# Patient Record
Sex: Male | Born: 1960 | Race: White | Hispanic: No | Smoking: Never smoker
Health system: Southern US, Community
[De-identification: ages and names within clinical notes are randomized; demographics above are authoritative.]

## PROBLEM LIST (undated history)

## (undated) DIAGNOSIS — G4730 Sleep apnea, unspecified: Secondary | ICD-10-CM

## (undated) DIAGNOSIS — F419 Anxiety disorder, unspecified: Secondary | ICD-10-CM

## (undated) DIAGNOSIS — M1990 Unspecified osteoarthritis, unspecified site: Secondary | ICD-10-CM

## (undated) DIAGNOSIS — I10 Essential (primary) hypertension: Secondary | ICD-10-CM

## (undated) HISTORY — DX: Sleep apnea, unspecified: G47.30

## (undated) HISTORY — DX: Anxiety disorder, unspecified: F41.9

## (undated) HISTORY — DX: Unspecified osteoarthritis, unspecified site: M19.90

## (undated) HISTORY — DX: Essential (primary) hypertension: I10

---

## 2000-08-16 ENCOUNTER — Encounter: Payer: Self-pay | Admitting: Family Medicine

## 2000-08-16 ENCOUNTER — Encounter: Admission: RE | Admit: 2000-08-16 | Discharge: 2000-08-16 | Payer: Self-pay | Admitting: Family Medicine

## 2001-02-01 ENCOUNTER — Encounter: Payer: Self-pay | Admitting: Emergency Medicine

## 2001-02-02 ENCOUNTER — Encounter: Payer: Self-pay | Admitting: Emergency Medicine

## 2001-02-02 ENCOUNTER — Inpatient Hospital Stay (HOSPITAL_COMMUNITY): Admission: AC | Admit: 2001-02-02 | Discharge: 2001-02-06 | Payer: Self-pay | Admitting: Emergency Medicine

## 2003-12-05 ENCOUNTER — Encounter: Admission: RE | Admit: 2003-12-05 | Discharge: 2003-12-05 | Payer: Self-pay | Admitting: Family Medicine

## 2004-08-16 ENCOUNTER — Ambulatory Visit: Payer: Self-pay | Admitting: Oncology

## 2005-08-28 ENCOUNTER — Observation Stay (HOSPITAL_COMMUNITY): Admission: EM | Admit: 2005-08-28 | Discharge: 2005-08-29 | Payer: Self-pay | Admitting: Emergency Medicine

## 2007-02-05 ENCOUNTER — Ambulatory Visit (HOSPITAL_BASED_OUTPATIENT_CLINIC_OR_DEPARTMENT_OTHER): Admission: RE | Admit: 2007-02-05 | Discharge: 2007-02-05 | Payer: Self-pay | Admitting: Family Medicine

## 2007-02-08 ENCOUNTER — Ambulatory Visit: Payer: Self-pay | Admitting: Internal Medicine

## 2011-01-09 NOTE — Procedures (Signed)
NAME:  Danny Wilkerson, Danny Wilkerson             ACCOUNT NO.:  [id number]   MEDICAL RECORD NO.:  [id number]          PATIENT TYPE:  OUT   LOCATION:  SLEEP CENTER                 FACILITY:  [facility]   PHYSICIAN:  Clinton D. Maple Hudson, MD, FCCP, FACPDATE OF BIRTH:  1959-02-10   DATE OF STUDY:  02/05/2007                            NOCTURNAL POLYSOMNOGRAM   REFERRING PHYSICIAN:   REFERRING PHYSICIAN:  Gloriajean Dell. Andrey Campanile, MD.   INDICATION FOR STUDY:  Hypersomnia with sleep apnea.   EPWORTH SLEEPINESS SCORE:  2/24, BMI 29, weight 193 pounds.   HOME MEDICATIONS:  Listed and reviewed.   SLEEP ARCHITECTURE:  Total sleep time 294 minutes with sleep efficiency  62%.  Stage 1 was 4%, stage 2 63%, stages 3 and 4 10%, REM 24% of total  sleep time.  Sleep latency 32 minutes, REM latency 119 minutes, awake  after sleep onset 67 minutes, arousal index 15.9.  No bedtime medication  was taken.   RESPIRATORY DATA:  Split study protocol.  Apnea/hypopnea index (AHI,  RDI) 60.2 obstructive events per hour indicating severe obstructive  sleep apnea/hypopnea syndrome before CPAP.  This included 59 obstructive  apneas and 64 hypopneas before CPAP.  He slept almost exclusively supine  and most events were recorded in that position.  REM AHI 7.7.  CPAP was  titrated to 7 CWP, AHI 3 per hour.  A medium Comfort Gel nasal mask was  used with heated humidifier.   OXYGEN DATA:  Moderate snoring with oxygen desaturation transiently to a  nadir of 86%.  Mean oxygen saturation after CPAP control was 97% on room  air.   CARDIAC DATA:  Normal sinus rhythm.   MOVEMENT/PARASOMNIA:  Occasional limb jerk with little effect on sleep.   IMPRESSION/RECOMMENDATION:  1. Severe obstructive sleep apnea/hypopnea syndrome, apnea/hypopnea      index 60.2 per hour with most events and sleep recorded while      supine.  Moderate snoring with oxygen desaturation to a nadir of      86%.  2. Successful continuous positive airway pressure titration to  7      centimeters of water pressure, apnea/hypopnea index 3 per hour.  A      medium Comfort Gel nasal mask was used with heated humidifier.      Clinton D. Maple Hudson, MD, [facility], FACP  Diplomate, Biomedical engineer of Sleep Medicine  Electronically Signed     CDY/MEDQ  D:  02/09/2007 16:27:54  T:  02/09/2007 23:29:45  Job:  [id number]   cc:   Gloriajean Dell. Andrey Campanile, M.D.  Fax: [phone]
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
## 2013-09-24 DIAGNOSIS — R079 Chest pain, unspecified: Secondary | ICD-10-CM | POA: Insufficient documentation

## 2013-09-24 DIAGNOSIS — R059 Cough, unspecified: Secondary | ICD-10-CM | POA: Insufficient documentation

## 2013-09-24 DIAGNOSIS — F411 Generalized anxiety disorder: Secondary | ICD-10-CM | POA: Insufficient documentation

## 2013-09-24 DIAGNOSIS — F32A Depression, unspecified: Secondary | ICD-10-CM | POA: Insufficient documentation

## 2013-09-24 DIAGNOSIS — Q849 Congenital malformation of integument, unspecified: Secondary | ICD-10-CM | POA: Insufficient documentation

## 2013-09-24 DIAGNOSIS — I10 Essential (primary) hypertension: Secondary | ICD-10-CM | POA: Insufficient documentation

## 2014-03-27 ENCOUNTER — Encounter: Payer: Self-pay | Admitting: *Deleted

## 2017-12-24 ENCOUNTER — Other Ambulatory Visit: Payer: Self-pay | Admitting: Internal Medicine

## 2017-12-24 DIAGNOSIS — M5432 Sciatica, left side: Secondary | ICD-10-CM

## 2018-01-16 ENCOUNTER — Ambulatory Visit
Admission: RE | Admit: 2018-01-16 | Discharge: 2018-01-16 | Disposition: A | Discharge status: Home / Self Care | Payer: 59 | Source: Ambulatory Visit | Attending: Internal Medicine | Admitting: Internal Medicine

## 2018-01-16 DIAGNOSIS — M5432 Sciatica, left side: Secondary | ICD-10-CM

## 2019-09-24 DIAGNOSIS — Z79899 Other long term (current) drug therapy: Secondary | ICD-10-CM | POA: Diagnosis not present

## 2019-09-24 DIAGNOSIS — Z13220 Encounter for screening for lipoid disorders: Secondary | ICD-10-CM | POA: Diagnosis not present

## 2019-09-24 DIAGNOSIS — M109 Gout, unspecified: Secondary | ICD-10-CM | POA: Diagnosis not present

## 2019-09-24 DIAGNOSIS — R7303 Prediabetes: Secondary | ICD-10-CM | POA: Diagnosis not present

## 2019-09-24 DIAGNOSIS — Z0000 Encounter for general adult medical examination without abnormal findings: Secondary | ICD-10-CM | POA: Diagnosis not present

## 2019-09-24 DIAGNOSIS — Z03818 Encounter for observation for suspected exposure to other biological agents ruled out: Secondary | ICD-10-CM | POA: Diagnosis not present

## 2019-12-14 DIAGNOSIS — E559 Vitamin D deficiency, unspecified: Secondary | ICD-10-CM | POA: Diagnosis not present

## 2019-12-14 DIAGNOSIS — M5432 Sciatica, left side: Secondary | ICD-10-CM | POA: Diagnosis not present

## 2019-12-14 DIAGNOSIS — E782 Mixed hyperlipidemia: Secondary | ICD-10-CM | POA: Diagnosis not present

## 2019-12-14 DIAGNOSIS — I10 Essential (primary) hypertension: Secondary | ICD-10-CM | POA: Diagnosis not present

## 2019-12-14 DIAGNOSIS — Z79899 Other long term (current) drug therapy: Secondary | ICD-10-CM | POA: Diagnosis not present

## 2020-05-05 DIAGNOSIS — M5432 Sciatica, left side: Secondary | ICD-10-CM | POA: Diagnosis not present

## 2020-05-05 DIAGNOSIS — I10 Essential (primary) hypertension: Secondary | ICD-10-CM | POA: Diagnosis not present

## 2020-05-05 DIAGNOSIS — F430 Acute stress reaction: Secondary | ICD-10-CM | POA: Diagnosis not present

## 2020-05-05 DIAGNOSIS — F410 Panic disorder [episodic paroxysmal anxiety] without agoraphobia: Secondary | ICD-10-CM | POA: Diagnosis not present

## 2020-05-05 DIAGNOSIS — Z79899 Other long term (current) drug therapy: Secondary | ICD-10-CM | POA: Diagnosis not present

## 2020-05-24 ENCOUNTER — Other Ambulatory Visit: Payer: Self-pay | Admitting: *Deleted

## 2020-05-24 ENCOUNTER — Encounter: Payer: Self-pay | Admitting: *Deleted

## 2020-08-29 ENCOUNTER — Encounter: Payer: Self-pay | Admitting: Family Medicine

## 2020-08-29 ENCOUNTER — Ambulatory Visit (INDEPENDENT_AMBULATORY_CARE_PROVIDER_SITE_OTHER): Payer: BC Managed Care – PPO | Admitting: Family Medicine

## 2020-08-29 ENCOUNTER — Ambulatory Visit (INDEPENDENT_AMBULATORY_CARE_PROVIDER_SITE_OTHER): Payer: BC Managed Care – PPO

## 2020-08-29 ENCOUNTER — Other Ambulatory Visit: Payer: Self-pay

## 2020-08-29 VITALS — BP 143/72 | HR 82 | Temp 98.5°F | Ht 68.0 in | Wt 218.0 lb

## 2020-08-29 DIAGNOSIS — R079 Chest pain, unspecified: Secondary | ICD-10-CM

## 2020-08-29 DIAGNOSIS — Z13220 Encounter for screening for lipoid disorders: Secondary | ICD-10-CM

## 2020-08-29 DIAGNOSIS — Z125 Encounter for screening for malignant neoplasm of prostate: Secondary | ICD-10-CM

## 2020-08-29 DIAGNOSIS — K219 Gastro-esophageal reflux disease without esophagitis: Secondary | ICD-10-CM

## 2020-08-29 DIAGNOSIS — H9319 Tinnitus, unspecified ear: Secondary | ICD-10-CM

## 2020-08-29 DIAGNOSIS — Z0001 Encounter for general adult medical examination with abnormal findings: Secondary | ICD-10-CM | POA: Diagnosis not present

## 2020-08-29 DIAGNOSIS — S76219A Strain of adductor muscle, fascia and tendon of unspecified thigh, initial encounter: Secondary | ICD-10-CM | POA: Diagnosis not present

## 2020-08-29 DIAGNOSIS — L570 Actinic keratosis: Secondary | ICD-10-CM

## 2020-08-29 DIAGNOSIS — R739 Hyperglycemia, unspecified: Secondary | ICD-10-CM | POA: Diagnosis not present

## 2020-08-29 DIAGNOSIS — I10 Essential (primary) hypertension: Secondary | ICD-10-CM

## 2020-08-29 DIAGNOSIS — M1990 Unspecified osteoarthritis, unspecified site: Secondary | ICD-10-CM

## 2020-08-29 DIAGNOSIS — Z1211 Encounter for screening for malignant neoplasm of colon: Secondary | ICD-10-CM

## 2020-08-29 LAB — COMPREHENSIVE METABOLIC PANEL
ALT: 52 U/L (ref 0–53)
AST: 40 U/L — ABNORMAL HIGH (ref 0–37)
Albumin: 4.4 g/dL (ref 3.5–5.2)
Alkaline Phosphatase: 64 U/L (ref 39–117)
BUN: 11 mg/dL (ref 6–23)
CO2: 27 mEq/L (ref 19–32)
Calcium: 9.3 mg/dL (ref 8.4–10.5)
Chloride: 101 mEq/L (ref 96–112)
Creatinine, Ser: 0.86 mg/dL (ref 0.40–1.50)
GFR: 93.2 mL/min (ref >60.00)
Glucose, Bld: 111 mg/dL — ABNORMAL HIGH (ref 70–99)
Potassium: 4.1 mEq/L (ref 3.5–5.1)
Sodium: 136 mEq/L (ref 135–145)
Total Bilirubin: 0.5 mg/dL (ref 0.2–1.2)
Total Protein: 6.9 g/dL (ref 6.0–8.3)

## 2020-08-29 LAB — LIPID PANEL
Cholesterol: 194 mg/dL (ref 0–200)
HDL: 28.9 mg/dL — ABNORMAL LOW (ref >39.00)
NonHDL: 165.42
Total CHOL/HDL Ratio: 7
Triglycerides: 218 mg/dL — ABNORMAL HIGH (ref 0.0–149.0)
VLDL: 43.6 mg/dL — ABNORMAL HIGH (ref 0.0–40.0)

## 2020-08-29 LAB — CBC
HCT: 46.9 % (ref 39.0–52.0)
Hemoglobin: 16.1 g/dL (ref 13.0–17.0)
MCHC: 34.2 g/dL (ref 30.0–36.0)
MCV: 97.2 fl (ref 78.0–100.0)
Platelets: 188 10*3/uL (ref 150.0–400.0)
RBC: 4.83 Mil/uL (ref 4.22–5.81)
RDW: 12.8 % (ref 11.5–15.5)
WBC: 8.4 10*3/uL (ref 4.0–10.5)

## 2020-08-29 LAB — LDL CHOLESTEROL, DIRECT: Direct LDL: 136 mg/dL

## 2020-08-29 LAB — PSA: PSA: 6.1 ng/mL — ABNORMAL HIGH (ref 0.10–4.00)

## 2020-08-29 LAB — TSH: TSH: 2.21 u[IU]/mL (ref 0.35–4.50)

## 2020-08-29 LAB — HEMOGLOBIN A1C: Hgb A1c MFr Bld: 6.3 % (ref 4.6–6.5)

## 2020-08-29 NOTE — Progress Notes (Signed)
Chief Complaint:  Danny Wilkerson is a 60 y.o. male who presents today for his annual comprehensive physical exam.    Assessment/Plan:  New/Acute Problems: Actinic Keratosis Cryotherapy applied today.  See below procedure note.  He tolerated well.  Chest Pain Atypical for cardiac etiology.  EKG today with normal sinus rhythm and no ischemic changes.  Will check chest x-ray.  Likely musculoskeletal given reproducibility on exam.  Groin Strain No red flags.  Discussed home exercises and handout was given.  Chronic Problems Addressed Today: GERD (gastroesophageal reflux disease) On omeprazole 40mg  daily and tolerating well.   Arthritis Has seen back doctor in the past.  Does not currently want surgery.  Symptoms are manageable.  Tinnitus Stable.  Benign essential hypertension At goal per JNC 8.  Amlodipine-valsartan 5-160 daily.   Body mass index is 33.15 kg/m. / Obese  BMI Metric Follow Up - 08/29/20 1012      BMI Metric Follow Up-Please document annually   BMI Metric Follow Up Education provided            Preventative Healthcare: We will place referral for colonoscopy.  Check CBC, CMET, TSH, PSA, lipid panel.  Patient Counseling(The following topics were reviewed and/or handout was given):  -Nutrition: Stressed importance of moderation in sodium/caffeine intake, saturated fat and cholesterol, caloric balance, sufficient intake of fresh fruits, vegetables, and fiber.  -Stressed the importance of regular exercise.   -Substance Abuse: Discussed cessation/primary prevention of tobacco, alcohol, or other drug use; driving or other dangerous activities under the influence; availability of treatment for abuse.   -Injury prevention: Discussed safety belts, safety helmets, smoke detector, smoking near bedding or upholstery.   -Sexuality: Discussed sexually transmitted diseases, partner selection, use of condoms, avoidance of unintended pregnancy and contraceptive  alternatives.   -Dental health: Discussed importance of regular tooth brushing, flossing, and dental visits.  -Health maintenance and immunizations reviewed. Please refer to Health maintenance section.  Return to care in 1 year for next preventative visit.     Subjective:  HPI:  See A/p for status of chronic conditions.  He has a few additional issues he would like to discuss today:  He has been having right-sided chest pain for the past year or so.  Worse with certain motions such as twisting or bending.  No shortness of breath.  No pain at rest.  Recently also had an episode of chest pain on the left side of his chest.  This occurred with exertion.  Lasted for a few hours and then went away.  He also has a spot on the scalp that has been there for several years he would like to have looked at.  Will occasionally become crusted over and he will take it off.  He also has had left groin pain for about a year. He injuried it about a year ago while doing a class.  Worse with movement.  No pain at rest.  Lifestyle Diet: Balanced. Getting plenty of fruits and vegetables.  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   Depression screen PHQ 2/9 08/29/2020  Decreased Interest 0  Down, Depressed, Hopeless 0  PHQ - 2 Score 0   Health Maintenance Due  Topic Date Due  . Hepatitis C Screening  Never done  . HIV Screening  Never done  . COLONOSCOPY (Pts 45-28yrs Insurance coverage will need to be confirmed)  08/28/2019    ROS: Per HPI, otherwise a complete review of systems was negative.  PMH:  The following were reviewed and entered/updated in epic: Past Medical History:  Diagnosis Date  . Anxiety   . Arthritis   . Hypertension   . Sleep apnea    Patient Active Problem List   Diagnosis Date Noted  . Tinnitus 08/29/2020  . GERD (gastroesophageal reflux disease) 08/29/2020  . Arthritis   . Benign essential hypertension 09/24/2013   History reviewed.  No pertinent surgical history.  Family History  Problem Relation Age of Onset  . Arthritis Mother   . Cancer Mother   . Depression Mother   . Hearing loss Mother   . Arthritis Father   . COPD Father   . Hearing loss Father     Medications- reviewed and updated Current Outpatient Medications  Medication Sig Dispense Refill  . amLODipine-valsartan (EXFORGE) 5-160 MG tablet Take 1 tablet by mouth daily.    . NEOMYCIN-POLYMYXIN-HYDROCORTISONE (CORTISPORIN) 1 % SOLN OTIC solution SMARTSIG:2 Drop(s) In Ear(s) 3 Times Daily PRN    . omeprazole (PRILOSEC) 40 MG capsule Take 40 mg by mouth daily.     No current facility-administered medications for this visit.    Allergies-reviewed and updated Allergies  Allergen Reactions  . Ampicillin Other (See Comments)    Childhood reaction  . Meperidine Nausea And Vomiting  . Prednisone Other (See Comments)    SUICIDAL    Social History   Socioeconomic History  . Marital status: Divorced    Spouse name: Not on file  . Number of children: Not on file  . Years of education: Not on file  . Highest education level: Not on file  Occupational History  . Not on file  Tobacco Use  . Smoking status: Never Smoker  . Smokeless tobacco: Not on file  Substance and Sexual Activity  . Alcohol use: Yes    Alcohol/week: 12.0 standard drinks    Types: 12 Cans of beer per week  . Drug use: Never  . Sexual activity: Not Currently  Other Topics Concern  . Not on file  Social History Narrative  . Not on file   Social Determinants of Health   Financial Resource Strain: Not on file  Food Insecurity: Not on file  Transportation Needs: Not on file  Physical Activity: Not on file  Stress: Not on file  Social Connections: Not on file        Objective:  Physical Exam: BP (!) 143/72   Pulse 82   Temp 98.5 F (36.9 C) (Temporal)   Ht 5\' 8"  (1.727 m)   Wt 218 lb (98.9 kg)   SpO2 98%   BMI 33.15 kg/m   Body mass index is 33.15 kg/m. Wt  Readings from Last 3 Encounters:  08/29/20 218 lb (98.9 kg)   Gen: NAD, resting comfortably HEENT: TMs normal bilaterally. OP clear. No thyromegaly noted.  CV: RRR with no murmurs appreciated Pulm: NWOB, CTAB with no crackles, wheezes, or rhonchi GI: Normal bowel sounds present. Soft, Nontender, Nondistended. MSK: no edema, cyanosis, or clubbing noted.  Chest pain reproducible on exam on right side of chest.  Pain elicited with left hip adduction.  No hernias noted. Skin: warm, dry.  Excoriated lesion approximately 5 mm in diameter on right frontal scalp. Neuro: CN2-12 grossly intact. Strength 5/5 in upper and lower extremities. Reflexes symmetric and intact bilaterally.  Psych: Normal affect and thought content  EKG: NSR.  No ischemic changes.  Cryotherapy Procedure Note  Pre-operative Diagnosis: Actinic keratosis  Locations: Right forehead  Indications: Therapeutic  Procedure  Details  Patient informed of risks (permanent scarring, infection, light or dark discoloration, bleeding, infection, weakness, numbness and recurrence of the lesion) and benefits of the procedure and verbal informed consent obtained.  The areas are treated with liquid nitrogen therapy, frozen until ice ball extended 2 mm beyond lesion, allowed to thaw, and treated again. The patient tolerated procedure well.  The patient was instructed on post-op care, warned that there may be blister formation, redness and pain. Recommend OTC analgesia as needed for pain.  Condition: Stable  Complications: none.       Algis Greenhouse. Jerline Pain, MD 08/29/2020 10:13 AM

## 2020-08-29 NOTE — Assessment & Plan Note (Signed)
Has seen back doctor in the past.  Does not currently want surgery.  Symptoms are manageable.

## 2020-08-29 NOTE — Assessment & Plan Note (Signed)
At goal per JNC 8.  Amlodipine-valsartan 5-160 daily.

## 2020-08-29 NOTE — Assessment & Plan Note (Signed)
On omeprazole 40mg  daily and tolerating well.

## 2020-08-29 NOTE — Addendum Note (Signed)
Addended by: Lurlean Horns on: 08/29/2020 10:27 AM   Modules accepted: Orders

## 2020-08-29 NOTE — Assessment & Plan Note (Signed)
Stable

## 2020-08-29 NOTE — Patient Instructions (Addendum)
It was very nice to see you today!  Your EKG today is normal.  Please let me know if you have any substernal chest pressure or any associated shortness of breath.  We will check a chest x-ray today.  We froze a spot on your forehead today.  Please let me know if it does not improve.  I think you have a groin strain as well.  Please try working on a home exercises.  No other changes today we will check blood work.  I will see back in year for your next physical.  Please come back to see me sooner if needed.  Take care, Dr Jerline Pain  Please try these tips to maintain a healthy lifestyle:   Eat at least 3 REAL meals and 1-2 snacks per day.  Aim for no more than 5 hours between eating.  If you eat breakfast, please do so within one hour of getting up.    Each meal should contain half fruits/vegetables, one quarter protein, and one quarter carbs (no bigger than a computer mouse)   Cut down on sweet beverages. This includes juice, soda, and sweet tea.     Drink at least 1 glass of water with each meal and aim for at least 8 glasses per day   Exercise at least 150 minutes every week.    Preventive Care 68-54 Years Old, Male Preventive care refers to lifestyle choices and visits with your health care provider that can promote health and wellness. This includes:  A yearly physical exam. This is also called an annual well check.  Regular dental and eye exams.  Immunizations.  Screening for certain conditions.  Healthy lifestyle choices, such as eating a healthy diet, getting regular exercise, not using drugs or products that contain nicotine and tobacco, and limiting alcohol use. What can I expect for my preventive care visit? Physical exam Your health care provider will check:  Height and weight. These may be used to calculate body mass index (BMI), which is a measurement that tells if you are at a healthy weight.  Heart rate and blood pressure.  Your skin for abnormal  spots. Counseling Your health care provider may ask you questions about:  Alcohol, tobacco, and drug use.  Emotional well-being.  Home and relationship well-being.  Sexual activity.  Eating habits.  Work and work Statistician. What immunizations do I need?  Influenza (flu) vaccine  This is recommended every year. Tetanus, diphtheria, and pertussis (Tdap) vaccine  You may need a Td booster every 10 years. Varicella (chickenpox) vaccine  You may need this vaccine if you have not already been vaccinated. Zoster (shingles) vaccine  You may need this after age 9. Measles, mumps, and rubella (MMR) vaccine  You may need at least one dose of MMR if you were born in 1957 or later. You may also need a second dose. Pneumococcal conjugate (PCV13) vaccine  You may need this if you have certain conditions and were not previously vaccinated. Pneumococcal polysaccharide (PPSV23) vaccine  You may need one or two doses if you smoke cigarettes or if you have certain conditions. Meningococcal conjugate (MenACWY) vaccine  You may need this if you have certain conditions. Hepatitis A vaccine  You may need this if you have certain conditions or if you travel or work in places where you may be exposed to hepatitis A. Hepatitis B vaccine  You may need this if you have certain conditions or if you travel or work in places where you may  be exposed to hepatitis B. Haemophilus influenzae type b (Hib) vaccine  You may need this if you have certain risk factors. Human papillomavirus (HPV) vaccine  If recommended by your health care provider, you may need three doses over 6 months. You may receive vaccines as individual doses or as more than one vaccine together in one shot (combination vaccines). Talk with your health care provider about the risks and benefits of combination vaccines. What tests do I need? Blood tests  Lipid and cholesterol levels. These may be checked every 5 years, or  more frequently if you are over 55 years old.  Hepatitis C test.  Hepatitis B test. Screening  Lung cancer screening. You may have this screening every year starting at age 49 if you have a 30-pack-year history of smoking and currently smoke or have quit within the past 15 years.  Prostate cancer screening. Recommendations will vary depending on your family history and other risks.  Colorectal cancer screening. All adults should have this screening starting at age 64 and continuing until age 53. Your health care provider may recommend screening at age 80 if you are at increased risk. You will have tests every 1-10 years, depending on your results and the type of screening test.  Diabetes screening. This is done by checking your blood sugar (glucose) after you have not eaten for a while (fasting). You may have this done every 1-3 years.  Sexually transmitted disease (STD) testing. Follow these instructions at home: Eating and drinking  Eat a diet that includes fresh fruits and vegetables, whole grains, lean protein, and low-fat dairy products.  Take vitamin and mineral supplements as recommended by your health care provider.  Do not drink alcohol if your health care provider tells you not to drink.  If you drink alcohol: ? Limit how much you have to 0-2 drinks a day. ? Be aware of how much alcohol is in your drink. In the [place] one drink equals one 12 oz bottle of beer (355 mL), one 5 oz glass of wine (148 mL), or one 1 oz glass of hard liquor (44 mL). Lifestyle  Take daily care of your teeth and gums.  Stay active. Exercise for at least 30 minutes on 5 or more days each week.  Do not use any products that contain nicotine or tobacco, such as cigarettes, e-cigarettes, and chewing tobacco. If you need help quitting, ask your health care provider.  If you are sexually active, practice safe sex. Use a condom or other form of protection to prevent STIs (sexually transmitted  infections).  Talk with your health care provider about taking a low-dose aspirin every day starting at age 61. What's next?  Go to your health care provider once a year for a well check visit.  Ask your health care provider how often you should have your eyes and teeth checked.  Stay up to date on all vaccines. This information is not intended to replace advice given to you by your health care provider. Make sure you discuss any questions you have with your health care provider. Document Revised: 08/07/2018 Document Reviewed: 08/07/2018 Elsevier Patient Education  2020 [employer].

## 2020-08-30 ENCOUNTER — Encounter: Payer: Self-pay | Admitting: Family Medicine

## 2020-08-30 DIAGNOSIS — R972 Elevated prostate specific antigen [PSA]: Secondary | ICD-10-CM | POA: Insufficient documentation

## 2020-08-30 DIAGNOSIS — R739 Hyperglycemia, unspecified: Secondary | ICD-10-CM | POA: Insufficient documentation

## 2020-08-30 DIAGNOSIS — E785 Hyperlipidemia, unspecified: Secondary | ICD-10-CM | POA: Insufficient documentation

## 2020-08-30 NOTE — Progress Notes (Signed)
Please inform patient of the following:  PSA is elevated. Recommend urology referral.  Cholesterol and blood sugar are both borderline.  He would benefit from starting Lipitor 40 mg daily to improve cholesterol numbers and lower risk of heart attack and stroke.  Regardless he should continue working on diet and exercise and we can recheck in a year.  All his other labs are normal.

## 2020-08-30 NOTE — Progress Notes (Signed)
Please inform patient of the following:  Chest xray is normal.  Oniel Meleski M. Jimmey Ralph, MD 08/30/2020 9:31 AM

## 2020-08-31 ENCOUNTER — Other Ambulatory Visit: Payer: Self-pay | Admitting: *Deleted

## 2020-08-31 DIAGNOSIS — R972 Elevated prostate specific antigen [PSA]: Secondary | ICD-10-CM

## 2020-08-31 MED ORDER — ATORVASTATIN CALCIUM 40 MG PO TABS: 40.0000 mg | ORAL_TABLET | Freq: Every day | ORAL | 3 refills | Status: DC

## 2020-08-31 NOTE — Progress Notes (Signed)
L 

## 2020-10-07 DIAGNOSIS — N5201 Erectile dysfunction due to arterial insufficiency: Secondary | ICD-10-CM | POA: Diagnosis not present

## 2020-10-07 DIAGNOSIS — R972 Elevated prostate specific antigen [PSA]: Secondary | ICD-10-CM | POA: Diagnosis not present

## 2020-10-19 DIAGNOSIS — Z01818 Encounter for other preprocedural examination: Secondary | ICD-10-CM | POA: Diagnosis not present

## 2020-10-19 DIAGNOSIS — Z1211 Encounter for screening for malignant neoplasm of colon: Secondary | ICD-10-CM | POA: Diagnosis not present

## 2020-10-19 DIAGNOSIS — R7989 Other specified abnormal findings of blood chemistry: Secondary | ICD-10-CM | POA: Diagnosis not present

## 2020-10-19 LAB — HM COLONOSCOPY

## 2020-11-08 DIAGNOSIS — K635 Polyp of colon: Secondary | ICD-10-CM | POA: Diagnosis not present

## 2020-11-08 DIAGNOSIS — Z1211 Encounter for screening for malignant neoplasm of colon: Secondary | ICD-10-CM | POA: Diagnosis not present

## 2020-11-08 DIAGNOSIS — R945 Abnormal results of liver function studies: Secondary | ICD-10-CM | POA: Diagnosis not present

## 2020-11-15 DIAGNOSIS — K635 Polyp of colon: Secondary | ICD-10-CM | POA: Diagnosis not present

## 2020-12-26 ENCOUNTER — Other Ambulatory Visit: Payer: Self-pay

## 2020-12-26 MED ORDER — AMLODIPINE BESYLATE-VALSARTAN 5-160 MG PO TABS: 1.0000 | ORAL_TABLET | Freq: Every day | ORAL | 3 refills | Status: DC

## 2020-12-26 NOTE — Telephone Encounter (Signed)
MEDICATION: amLODipine-valsartan (EXFORGE) 5-160 MG tablet  PHARMACY:  [id number] - [place], [place] - [address] Phone:  [phone]  Fax:  [phone]       Comments: Patient said he is out.   **Let patient know to contact pharmacy at the end of the day to make sure medication is ready. **  ** Please notify patient to allow 48-72 hours to process**  **Encourage patient to contact the pharmacy for refills or they can request refills through [facility]**

## 2020-12-26 NOTE — Telephone Encounter (Signed)
LAST APPOINTMENT DATE: 08/29/2020   NEXT APPOINTMENT DATE: Visit date not found    LAST REFILL: [id number]  QTY: Last refill done by historical provider

## 2020-12-28 DIAGNOSIS — R7989 Other specified abnormal findings of blood chemistry: Secondary | ICD-10-CM | POA: Diagnosis not present

## 2020-12-28 DIAGNOSIS — K648 Other hemorrhoids: Secondary | ICD-10-CM | POA: Diagnosis not present

## 2020-12-28 DIAGNOSIS — D126 Benign neoplasm of colon, unspecified: Secondary | ICD-10-CM | POA: Diagnosis not present

## 2021-04-27 DIAGNOSIS — S0501XA Injury of conjunctiva and corneal abrasion without foreign body, right eye, initial encounter: Secondary | ICD-10-CM | POA: Diagnosis not present

## 2021-07-12 ENCOUNTER — Encounter: Payer: Self-pay | Admitting: Family Medicine

## 2021-08-20 ENCOUNTER — Other Ambulatory Visit: Payer: Self-pay | Admitting: Family Medicine

## 2021-08-30 ENCOUNTER — Other Ambulatory Visit: Payer: Self-pay

## 2021-08-30 ENCOUNTER — Encounter: Payer: Self-pay | Admitting: Family Medicine

## 2021-08-30 ENCOUNTER — Ambulatory Visit (INDEPENDENT_AMBULATORY_CARE_PROVIDER_SITE_OTHER): Payer: BC Managed Care – PPO | Admitting: Family Medicine

## 2021-08-30 VITALS — BP 124/68 | HR 79 | Temp 98.4°F | Ht 68.0 in | Wt 213.2 lb

## 2021-08-30 DIAGNOSIS — R739 Hyperglycemia, unspecified: Secondary | ICD-10-CM | POA: Diagnosis not present

## 2021-08-30 DIAGNOSIS — Z0001 Encounter for general adult medical examination with abnormal findings: Secondary | ICD-10-CM

## 2021-08-30 DIAGNOSIS — K219 Gastro-esophageal reflux disease without esophagitis: Secondary | ICD-10-CM | POA: Diagnosis not present

## 2021-08-30 DIAGNOSIS — F411 Generalized anxiety disorder: Secondary | ICD-10-CM | POA: Diagnosis not present

## 2021-08-30 DIAGNOSIS — Z0000 Encounter for general adult medical examination without abnormal findings: Secondary | ICD-10-CM | POA: Diagnosis not present

## 2021-08-30 DIAGNOSIS — E785 Hyperlipidemia, unspecified: Secondary | ICD-10-CM | POA: Diagnosis not present

## 2021-08-30 DIAGNOSIS — I10 Essential (primary) hypertension: Secondary | ICD-10-CM

## 2021-08-30 DIAGNOSIS — E669 Obesity, unspecified: Secondary | ICD-10-CM

## 2021-08-30 DIAGNOSIS — R972 Elevated prostate specific antigen [PSA]: Secondary | ICD-10-CM | POA: Diagnosis not present

## 2021-08-30 DIAGNOSIS — Z6832 Body mass index (BMI) 32.0-32.9, adult: Secondary | ICD-10-CM

## 2021-08-30 DIAGNOSIS — G8929 Other chronic pain: Secondary | ICD-10-CM | POA: Diagnosis not present

## 2021-08-30 LAB — COMPREHENSIVE METABOLIC PANEL
ALT: 20 U/L (ref 0–53)
AST: 20 U/L (ref 0–37)
Albumin: 4.4 g/dL (ref 3.5–5.2)
Alkaline Phosphatase: 58 U/L (ref 39–117)
BUN: 12 mg/dL (ref 6–23)
CO2: 26 mEq/L (ref 19–32)
Calcium: 9.6 mg/dL (ref 8.4–10.5)
Chloride: 104 mEq/L (ref 96–112)
Creatinine, Ser: 0.87 mg/dL (ref 0.40–1.50)
GFR: 92.22 mL/min (ref >60.00)
Glucose, Bld: 107 mg/dL — ABNORMAL HIGH (ref 70–99)
Potassium: 4.1 mEq/L (ref 3.5–5.1)
Sodium: 138 mEq/L (ref 135–145)
Total Bilirubin: 0.7 mg/dL (ref 0.2–1.2)
Total Protein: 7.2 g/dL (ref 6.0–8.3)

## 2021-08-30 LAB — TSH: TSH: 2.79 u[IU]/mL (ref 0.35–5.50)

## 2021-08-30 LAB — CBC
HCT: 42.3 % (ref 39.0–52.0)
Hemoglobin: 14.2 g/dL (ref 13.0–17.0)
MCHC: 33.6 g/dL (ref 30.0–36.0)
MCV: 95.4 fl (ref 78.0–100.0)
Platelets: 216 10*3/uL (ref 150.0–400.0)
RBC: 4.44 Mil/uL (ref 4.22–5.81)
RDW: 12.3 % (ref 11.5–15.5)
WBC: 7.5 10*3/uL (ref 4.0–10.5)

## 2021-08-30 LAB — LIPID PANEL
Cholesterol: 137 mg/dL (ref 0–200)
HDL: 33.3 mg/dL — ABNORMAL LOW (ref >39.00)
LDL Cholesterol: 71 mg/dL (ref 0–99)
NonHDL: 104.15
Total CHOL/HDL Ratio: 4
Triglycerides: 164 mg/dL — ABNORMAL HIGH (ref 0.0–149.0)
VLDL: 32.8 mg/dL (ref 0.0–40.0)

## 2021-08-30 LAB — PSA: PSA: 1.17 ng/mL (ref 0.10–4.00)

## 2021-08-30 LAB — HEMOGLOBIN A1C: Hgb A1c MFr Bld: 6 % (ref 4.6–6.5)

## 2021-08-30 MED ORDER — HYDROCODONE-ACETAMINOPHEN 5-325 MG PO TABS: 1.0000 | ORAL_TABLET | Freq: Four times a day (QID) | ORAL | 0 refills | Status: DC | PRN

## 2021-08-30 NOTE — Patient Instructions (Signed)
It was very nice to see you today!  We will check blood work today.  I will refill your medication.  We will check a cardiac CT scan.  I will see you back in a year for your next physical.   Please come back in 3-6 months for a med check if needed for your hydrocodone.   Come back to see me sooner if needed.   Take care, Dr Jimmey Ralph  PLEASE NOTE:  If you had any lab tests please let us know if you have not heard back within a few days. You may see your results on mychart before we have a chance to review them but we will give you a call once they are reviewed by [place]. If we ordered any referrals today, please let us know if you have not heard from their office within the next week.   Please try these tips to maintain a healthy lifestyle:  Eat at least 3 REAL meals and 1-2 snacks per day.  Aim for no more than 5 hours between eating.  If you eat breakfast, please do so within one hour of getting up.   Each meal should contain half fruits/vegetables, one quarter protein, and one quarter carbs (no bigger than a computer mouse)  Cut down on sweet beverages. This includes juice, soda, and sweet tea.   Drink at least 1 glass of water with each meal and aim for at least 8 glasses per day  Exercise at least 150 minutes every week.    Preventive Care 41-32 Years Old, Male Preventive care refers to lifestyle choices and visits with your health care provider that can promote health and wellness. Preventive care visits are also called wellness exams. What can I expect for my preventive care visit? Counseling During your preventive care visit, your health care provider may ask about your: Medical history, including: Past medical problems. Family medical history. Current health, including: Emotional well-being. Home life and relationship well-being. Sexual activity. Lifestyle, including: Alcohol, nicotine or tobacco, and drug use. Access to firearms. Diet, exercise, and sleep  habits. Safety issues such as seatbelt and bike helmet use. Sunscreen use. Work and work Astronomer. Physical exam Your health care provider will check your: Height and weight. These may be used to calculate your BMI (body mass index). BMI is a measurement that tells if you are at a healthy weight. Waist circumference. This measures the distance around your waistline. This measurement also tells if you are at a healthy weight and may help predict your risk of certain diseases, such as type 2 diabetes and high blood pressure. Heart rate and blood pressure. Body temperature. Skin for abnormal spots. What immunizations do I need? Vaccines are usually given at various ages, according to a schedule. Your health care provider will recommend vaccines for you based on your age, medical history, and lifestyle or other factors, such as travel or where you work. What tests do I need? Screening Your health care provider may recommend screening tests for certain conditions. This may include: Lipid and cholesterol levels. Diabetes screening. This is done by checking your blood sugar (glucose) after you have not eaten for a while (fasting). Hepatitis B test. Hepatitis C test. HIV (human immunodeficiency virus) test. STI (sexually transmitted infection) testing, if you are at risk. Lung cancer screening. Prostate cancer screening. Colorectal cancer screening. Talk with your health care provider about your test results, treatment options, and if necessary, the need for more tests. Follow these instructions at home: Eating  and drinking  Eat a diet that includes fresh fruits and vegetables, whole grains, lean protein, and low-fat dairy products. Take vitamin and mineral supplements as recommended by your health care provider. Do not drink alcohol if your health care provider tells you not to drink. If you drink alcohol: Limit how much you have to 0-2 drinks a day. Know how much alcohol is in your  drink. In the [place] one drink equals one 12 oz bottle of beer (355 mL), one 5 oz glass of wine (148 mL), or one 1 oz glass of hard liquor (44 mL). Lifestyle Brush your teeth every morning and night with fluoride toothpaste. Floss one time each day. Exercise for at least 30 minutes 5 or more days each week. Do not use any products that contain nicotine or tobacco. These products include cigarettes, chewing tobacco, and vaping devices, such as e-cigarettes. If you need help quitting, ask your health care provider. Do not use drugs. If you are sexually active, practice safe sex. Use a condom or other form of protection to prevent STIs. Take aspirin only as told by your health care provider. Make sure that you understand how much to take and what form to take. Work with your health care provider to find out whether it is safe and beneficial for you to take aspirin daily. Find healthy ways to manage stress, such as: Meditation, yoga, or listening to music. Journaling. Talking to a trusted person. Spending time with friends and family. Minimize exposure to UV radiation to reduce your risk of skin cancer. Safety Always wear your seat belt while driving or riding in a vehicle. Do not drive: If you have been drinking alcohol. Do not ride with someone who has been drinking. When you are tired or distracted. While texting. If you have been using any mind-altering substances or drugs. Wear a helmet and other protective equipment during sports activities. If you have firearms in your house, make sure you follow all gun safety procedures. What's next? Go to your health care provider once a year for an annual wellness visit. Ask your health care provider how often you should have your eyes and teeth checked. Stay up to date on all vaccines. This information is not intended to replace advice given to you by your health care provider. Make sure you discuss any questions you have with your health care  provider. Document Revised: 02/08/2021 Document Reviewed: 02/08/2021 Elsevier Patient Education  2022 [employer].

## 2021-08-30 NOTE — Assessment & Plan Note (Signed)
On omeprazole 40 mg daily. 

## 2021-08-30 NOTE — Assessment & Plan Note (Signed)
Uses lorazepam sparingly as needed for panic attacks. Does not need refill today.

## 2021-08-30 NOTE — Assessment & Plan Note (Signed)
On Lipitor 40 mg daily.  We will check lipids today.  Discussed lifestyle modifications.  We will check cardiac CT scan.

## 2021-08-30 NOTE — Assessment & Plan Note (Signed)
Database without red flags.  We will take over prescription of his norco 5-325mg  q6 hrs prn.  He only uses very sparingly as needed.  Will refill today.  No significant side effects and medications help with his ability to stay active. Follow-up in 3 months.  Controlled substance contract reviewed, signed, and scanned in chart.

## 2021-08-30 NOTE — Assessment & Plan Note (Signed)
Check A1c. 

## 2021-08-30 NOTE — Assessment & Plan Note (Signed)
At goal on amlodipine 5-160 once daily.

## 2021-08-30 NOTE — Progress Notes (Signed)
Chief Complaint:  Danny Wilkerson is a 60 y.o. male who presents today for his annual comprehensive physical exam.    Assessment/Plan:  Chronic Problems Addressed Today: Chronic pain Database without red flags.  We will take over prescription of his norco 5-325mg  q6 hrs prn.  He only uses very sparingly as needed.  Will refill today.  No significant side effects and medications help with his ability to stay active. Follow-up in 3 months.  Controlled substance contract reviewed, signed, and scanned in chart.  Generalized anxiety disorder Uses lorazepam sparingly as needed for panic attacks. Does not need refill today.   Hyperglycemia Check A1c.   Dyslipidemia On Lipitor 40 mg daily.  We will check lipids today.  Discussed lifestyle modifications.  We will check cardiac CT scan.  Elevated PSA Saw urology last year.  Repeat was down to 1.5.  We will recheck today.  GERD (gastroesophageal reflux disease) On omeprazole 40mg  daily.   Benign essential hypertension At goal on amlodipine 5-160 once daily.   Body mass index is 32.42 kg/m. / Obese  BMI Metric Follow Up - 08/30/21 1004       BMI Metric Follow Up-Please document annually   BMI Metric Follow Up Education provided              Preventative Healthcare: Check labs. Will check cardiac CT score. Up to date on colonoscopy.   Patient Counseling(The following topics were reviewed and/or handout was given):  -Nutrition: Stressed importance of moderation in sodium/caffeine intake, saturated fat and cholesterol, caloric balance, sufficient intake of fresh fruits, vegetables, and fiber.  -Stressed the importance of regular exercise.   -Substance Abuse: Discussed cessation/primary prevention of tobacco, alcohol, or other drug use; driving or other dangerous activities under the influence; availability of treatment for abuse.   -Injury prevention: Discussed safety belts, safety helmets, smoke detector, smoking near bedding  or upholstery.   -Sexuality: Discussed sexually transmitted diseases, partner selection, use of condoms, avoidance of unintended pregnancy and contraceptive alternatives.   -Dental health: Discussed importance of regular tooth brushing, flossing, and dental visits.  -Health maintenance and immunizations reviewed. Please refer to Health maintenance section.  Return to care in 1 year for next preventative visit.     Subjective:  HPI:  He has no acute complaints today.   See A/P for status of chronic conditions  He has been doing well for the last year.  He has issues with chronic low back pain due to degenerative disc disease.  He has been previously seeing a specialist at [facility] for pain management.  He has been prescribed hydrocodone-acetaminophen 5-3 25 take 1 tablet every 6 hours as needed.  He has been on this for several years.  He only uses very sparingly as needed.  He works with the [employer] and have flareups when he does tilt exercises.  No significant side effects.  He tries to avoid overuse.  Medications help with his ability to stay active and perform activities of daily living.  Lifestyle Diet: Balanced.  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   Depression screen PHQ 2/9 08/30/2021  Decreased Interest 0  Down, Depressed, Hopeless 0  PHQ - 2 Score 0   Health Maintenance Due  Topic Date Due   Pneumococcal Vaccine 49-49 Years old (1 - PCV) Never done   HIV Screening  Never done   Hepatitis C Screening  Never done   Zoster Vaccines- Shingrix (2 of 2) 05/22/2020   TETANUS/TDAP  10/24/2020     ROS: Per HPI, otherwise a complete review of systems was negative.   PMH:  The following were reviewed and entered/updated in epic: Past Medical History:  Diagnosis Date   Anxiety    Arthritis    Hypertension    Sleep apnea    Patient Active Problem List   Diagnosis Date Noted   Generalized anxiety disorder 08/30/2021   Chronic pain  08/30/2021   Elevated PSA 08/30/2020   Dyslipidemia 08/30/2020   Hyperglycemia 08/30/2020   Tinnitus 08/29/2020   GERD (gastroesophageal reflux disease) 08/29/2020   Arthritis    Benign essential hypertension 09/24/2013   History reviewed. No pertinent surgical history.  Family History  Problem Relation Age of Onset   Arthritis Mother    Cancer Mother    Depression Mother    Hearing loss Mother    Arthritis Father    COPD Father    Hearing loss Father     Medications- reviewed and updated Current Outpatient Medications  Medication Sig Dispense Refill   amLODipine-valsartan (EXFORGE) 5-160 MG tablet Take 1 tablet by mouth daily. 90 tablet 3   atorvastatin (LIPITOR) 40 MG tablet TAKE 1 TABLET BY MOUTH EVERY DAY 90 tablet 3   HYDROcodone-acetaminophen (NORCO/VICODIN) 5-325 MG tablet Take 1 tablet by mouth every 6 (six) hours as needed. 120 tablet 0   NEOMYCIN-POLYMYXIN-HYDROCORTISONE (CORTISPORIN) 1 % SOLN OTIC solution SMARTSIG:2 Drop(s) In Ear(s) 3 Times Daily PRN     omeprazole (PRILOSEC) 40 MG capsule Take 40 mg by mouth daily.     No current facility-administered medications for this visit.    Allergies-reviewed and updated Allergies  Allergen Reactions   Ampicillin Other (See Comments)    Childhood reaction   Meperidine Nausea And Vomiting   Prednisone Other (See Comments)    SUICIDAL    Social History   Socioeconomic History   Marital status: Divorced    Spouse name: Not on file   Number of children: Not on file   Years of education: Not on file   Highest education level: Not on file  Occupational History   Not on file  Tobacco Use   Smoking status: Never   Smokeless tobacco: Not on file  Substance and Sexual Activity   Alcohol use: Yes    Alcohol/week: 12.0 standard drinks    Types: 12 Cans of beer per week   Drug use: Never   Sexual activity: Not Currently  Other Topics Concern   Not on file  Social History Narrative   Not on file   Social  Determinants of Health   Financial Resource Strain: Not on file  Food Insecurity: Not on file  Transportation Needs: Not on file  Physical Activity: Not on file  Stress: Not on file  Social Connections: Not on file        Objective:  Physical Exam: BP 124/68    Pulse 79    Temp 98.4 F (36.9 C) (Temporal)    Ht 5\' 8"  (1.727 m)    Wt 213 lb 3.2 oz (96.7 kg)    SpO2 97%    BMI 32.42 kg/m   Body mass index is 32.42 kg/m. Wt Readings from Last 3 Encounters:  08/30/21 213 lb 3.2 oz (96.7 kg)  08/29/20 218 lb (98.9 kg)   Gen: NAD, resting comfortably HEENT: TMs normal bilaterally. OP clear. No thyromegaly noted.  CV: RRR with no murmurs appreciated Pulm: NWOB, CTAB with no crackles, wheezes, or rhonchi GI: Normal bowel sounds present.  Soft, Nontender, Nondistended. MSK: no edema, cyanosis, or clubbing noted Skin: warm, dry Neuro: CN2-12 grossly intact. Strength 5/5 in upper and lower extremities. Reflexes symmetric and intact bilaterally.  Psych: Normal affect and thought content  EKG: NSR. No ischemic changes.      Algis Greenhouse. Jerline Pain, MD 08/30/2021 10:14 AM

## 2021-08-30 NOTE — Assessment & Plan Note (Signed)
Saw urology last year.  Repeat was down to 1.5.  We will recheck today.

## 2021-08-31 NOTE — Progress Notes (Signed)
Please inform patient of the following:  Blood sugar is borderline but stable. His cholesterol levels are better than last year. Everything else is NORMAL. Do not need to make any changes to his treatment plan at this time. He should continue working on diet and exercise and we can recheck in a year.  Katina Degree. Jimmey Ralph, MD 08/31/2021 8:00 AM

## 2021-09-05 ENCOUNTER — Telehealth: Payer: Self-pay | Admitting: Family Medicine

## 2021-09-05 NOTE — Telephone Encounter (Signed)
Date of birth verified by patient  Lab results given,Pt verbalized understanding   

## 2021-09-05 NOTE — Telephone Encounter (Signed)
Pt is requesting callback about lab work at [phone]

## 2021-09-29 ENCOUNTER — Ambulatory Visit (INDEPENDENT_AMBULATORY_CARE_PROVIDER_SITE_OTHER)
Admission: RE | Admit: 2021-09-29 | Discharge: 2021-09-29 | Disposition: A | Discharge status: Home / Self Care | Payer: Self-pay | Source: Ambulatory Visit | Attending: Family Medicine | Admitting: Family Medicine

## 2021-09-29 ENCOUNTER — Other Ambulatory Visit: Payer: Self-pay

## 2021-09-29 DIAGNOSIS — E785 Hyperlipidemia, unspecified: Secondary | ICD-10-CM

## 2021-10-03 ENCOUNTER — Telehealth: Payer: Self-pay

## 2021-10-03 NOTE — Telephone Encounter (Signed)
Please advise 

## 2021-10-03 NOTE — Telephone Encounter (Signed)
See result note.  Danny Wilkerson. Jerline Pain, MD 10/03/2021 5:06 PM

## 2021-10-03 NOTE — Progress Notes (Signed)
Please inform patient of the following:  His calcium score is elevated. This is not a cause for urgent concern but we should refer him to cardiology. Would also be a good idea to increase his lipitor to 80mg  daily. Please send in new rx if he wishes.  . Katina Degree, MD 10/03/2021 4:53 PM

## 2021-10-03 NOTE — Telephone Encounter (Signed)
Patient is calling to get results of cardiac scoring test.  I have advised that Jimmey Ralph is out of the office.

## 2021-10-04 ENCOUNTER — Other Ambulatory Visit: Payer: Self-pay | Admitting: *Deleted

## 2021-10-04 DIAGNOSIS — R931 Abnormal findings on diagnostic imaging of heart and coronary circulation: Secondary | ICD-10-CM

## 2021-10-04 MED ORDER — ATORVASTATIN CALCIUM 80 MG PO TABS: 80.0000 mg | ORAL_TABLET | Freq: Every day | ORAL | 1 refills | Status: DC

## 2021-10-24 ENCOUNTER — Encounter: Payer: Self-pay | Admitting: Interventional Cardiology

## 2021-10-24 ENCOUNTER — Other Ambulatory Visit: Payer: Self-pay

## 2021-10-24 ENCOUNTER — Ambulatory Visit: Payer: BC Managed Care – PPO | Admitting: Interventional Cardiology

## 2021-10-24 ENCOUNTER — Encounter: Payer: Self-pay | Admitting: *Deleted

## 2021-10-24 VITALS — BP 122/70 | HR 67 | Ht 68.0 in | Wt 217.0 lb

## 2021-10-24 DIAGNOSIS — I2584 Coronary atherosclerosis due to calcified coronary lesion: Secondary | ICD-10-CM

## 2021-10-24 DIAGNOSIS — I2510 Atherosclerotic heart disease of native coronary artery without angina pectoris: Secondary | ICD-10-CM

## 2021-10-24 DIAGNOSIS — I10 Essential (primary) hypertension: Secondary | ICD-10-CM | POA: Diagnosis not present

## 2021-10-24 DIAGNOSIS — R072 Precordial pain: Secondary | ICD-10-CM

## 2021-10-24 DIAGNOSIS — E782 Mixed hyperlipidemia: Secondary | ICD-10-CM | POA: Diagnosis not present

## 2021-10-24 NOTE — Patient Instructions (Signed)
Medication Instructions:  Your physician recommends that you continue on your current medications as directed. Please refer to the Current Medication list given to you today.  *If you need a refill on your cardiac medications before your next appointment, please call your pharmacy*   Lab Work: none If you have labs (blood work) drawn today and your tests are completely normal, you will receive your results only by: [place] (if you have MyChart) OR A paper copy in the mail If you have any lab test that is abnormal or we need to change your treatment, we will call you to review the results.   Testing/Procedures: Your physician has requested that you have an exercise stress myoview. For further information please visit [email or web address]. Please follow instruction sheet, as given.    Follow-Up: At [facility], you and your health needs are our priority.  As part of our continuing mission to provide you with exceptional heart care, we have created designated Provider Care Teams.  These Care Teams include your primary Cardiologist (physician) and Advanced Practice Providers (APPs -  Physician Assistants and Nurse Practitioners) who all work together to provide you with the care you need, when you need it.  We recommend signing up for the patient portal called "MyChart".  Sign up information is provided on this After Visit Summary.  MyChart is used to connect with patients for Virtual Visits (Telemedicine).  Patients are able to view lab/test results, encounter notes, upcoming appointments, etc.  Non-urgent messages can be sent to your provider as well.   To learn more about what you can do with MyChart, go to [email or web address].    Your next appointment:   12 month(s)  The format for your next appointment:   In Person  Provider:   Larae Grooms, MD     Other Instructions

## 2021-10-24 NOTE — Progress Notes (Signed)
Cardiology Office Note   Date:  10/24/2021   ID:  TYVON EGGENBERGER, DOB [birth year], MRN [id number]  PCP:  Ardith Dark, MD    Chief Complaint  Patient presents with   Establish Care   Coronary artery calcification  Wt Readings from Last 3 Encounters:  10/24/21 217 lb (98.4 kg)  08/30/21 213 lb 3.2 oz (96.7 kg)  08/29/20 218 lb (98.9 kg)       History of Present Illness: Danny Wilkerson is a 61 y.o. male who is being seen today for the evaluation of coronary artery calcification at the request of Ardith Dark, MD.   The patient had a coronary calcium score in January 2023.: ""Coronary arteries: Normal origins.   Coronary Calcium Score:   Left main: 0   Left anterior descending artery: 482   Left circumflex artery: 148   Right coronary artery: 549   Total: 1178   Percentile: 96th   Pericardium: Normal.   Ascending Aorta: Normal caliber.  Scattered calcifications.   Non-cardiac: See separate report from [facility].   IMPRESSION: Coronary calcium score of 1178. This was 96th percentile for age-, race-, and sex-matched controls."  Older brother who had an MI.    Denies :  Dizziness. Leg edema. Nitroglycerin use. Orthopnea. Palpitations. Paroxysmal nocturnal dyspnea. Shortness of breath. Syncope.    He has had some left sided chest pain after helping to carry a casket.  Lasted a few hours.  No other associated symptoms.  He is declining back surgery.    He walks/hikes.  He works as a Metallurgist, [employer]ometrist forces. Training is very physical.  Past Medical History:  Diagnosis Date   Anxiety    Arthritis    Hypertension    Sleep apnea     No past surgical history on file.   Current Outpatient Medications  Medication Sig Dispense Refill   amLODipine-valsartan (EXFORGE) 5-160 MG tablet Take 1 tablet by mouth daily. 90 tablet 3   atorvastatin (LIPITOR) 80 MG tablet Take 1 tablet (80 mg total) by mouth  daily. 90 tablet 1   colchicine 0.6 MG tablet Take 1 tablet by mouth 2 (two) times daily as needed. Gout flare up     HYDROcodone-acetaminophen (NORCO/VICODIN) 5-325 MG tablet Take 1 tablet by mouth every 6 (six) hours as needed. 120 tablet 0   NEOMYCIN-POLYMYXIN-HYDROCORTISONE (CORTISPORIN) 1 % SOLN OTIC solution SMARTSIG:2 Drop(s) In Ear(s) 3 Times Daily PRN     omeprazole (PRILOSEC) 40 MG capsule Take 40 mg by mouth daily.     No current facility-administered medications for this visit.    Allergies:   Prednisone, Ampicillin, and Meperidine    Social History:  The patient  reports that he has never smoked. He does not have any smokeless tobacco history on file. He reports current alcohol use of about 12.0 standard drinks per week. He reports that he does not use drugs.   Family History:  The patient's family history includes Arthritis in his father and mother; COPD in his father; Cancer in his mother; Depression in his mother; Hearing loss in his father and mother; Heart attack in his brother.    ROS:  Please see the history of present illness.   Otherwise, review of systems are positive for back pain.   All other systems are reviewed and negative.    PHYSICAL EXAM: VS:  BP 122/70    Pulse 67    Ht 5\' 8"  (1.727 m)  Wt 217 lb (98.4 kg)    SpO2 97%    BMI 32.99 kg/m  , BMI Body mass index is 32.99 kg/m. GEN: Well nourished, well developed, in no acute distress HEENT: normal Neck: no JVD, carotid bruits, or masses Cardiac: RRR; no murmurs, rubs, or gallops,no edema  Respiratory:  clear to auscultation bilaterally, normal work of breathing GI: soft, nontender, nondistended, + BS MS: no deformity or atrophy Skin: warm and dry, no rash Neuro:  Strength and sensation are intact Psych: euthymic mood, full affect   EKG:   The ekg ordered  demonstrates normal ECG   Recent Labs: 08/30/2021: ALT 20; BUN 12; Creatinine, Ser 0.87; Hemoglobin 14.2; Platelets 216.0; Potassium 4.1; Sodium  138; TSH 2.79   Lipid Panel    Component Value Date/Time   CHOL 137 08/30/2021 1021   TRIG 164.0 (H) 08/30/2021 1021   HDL 33.30 (L) 08/30/2021 1021   CHOLHDL 4 08/30/2021 1021   VLDL 32.8 08/30/2021 1021   LDLCALC 71 08/30/2021 1021   LDLDIRECT 136.0 08/29/2020 1020     Other studies Reviewed: Additional studies/ records that were reviewed today with results demonstrating: labs reviewed- LDL well controlled.   ASSESSMENT AND PLAN:  Coronary artery calcification: He is in the 96 percentile for his age.  Continue aggressive secondary prevention.  He needs healthy lifestyle with regular exercise and healthy diet.  Given the atypical pains he is having, will plan for exercise Myoview.  Need to rule out obstructive coronary disease.  We discussed healthy lifestyle, regular exercise. Hyperlipidemia: LDL 71.  Continue high-dose atorvastatin.  Triglycerides mildly elevated.  Avoid processed foods.  Whole food, plant-based diet recommended. Hypertension: Avoid excessive salt.  Well-controlled when he stays on his medications.   Current medicines are reviewed at length with the patient today.  The patient concerns regarding his medicines were addressed.  The following changes have been made:  No change  Labs/ tests ordered today include:  No orders of the defined types were placed in this encounter.   Recommend 150 minutes/week of aerobic exercise Low fat, low carb, high fiber diet recommended  Disposition:   FU in 1 year, or sooner depending on stress test results   Signed, Lance Muss, MD  10/24/2021 8:59 AM    [facility] [address] [place], [place], [place]  [address] Phone: [phone]; Fax: [phone]

## 2021-10-26 ENCOUNTER — Telehealth (HOSPITAL_COMMUNITY): Payer: Self-pay | Admitting: *Deleted

## 2021-10-26 NOTE — Telephone Encounter (Signed)
Patient given detailed instructions per Myocardial Perfusion Study Information Sheet for the test on 10/30/2021 at 10:30. Patient notified to arrive 15 minutes early and that it is imperative to arrive on time for appointment to keep from having the test rescheduled. ? If you need to cancel or reschedule your appointment, please call the office within 24 hours of your appointment. . Patient verbalized understanding.Nelson Chimes S ? ? ?

## 2021-10-30 ENCOUNTER — Ambulatory Visit (HOSPITAL_COMMUNITY): Payer: BC Managed Care – PPO

## 2021-10-30 ENCOUNTER — Other Ambulatory Visit: Payer: Self-pay

## 2021-10-31 ENCOUNTER — Telehealth (HOSPITAL_COMMUNITY): Payer: Self-pay | Admitting: Interventional Cardiology

## 2021-10-31 NOTE — Telephone Encounter (Signed)
I checked with billing department to see what cost would be after patient's insurance applied.  Depends on his deductible as noted below. Rolanda Lundborg, RN ?Not really depending on if he has meet his deductible.  There will be an adjustment since we are in network with [employer].  The cpt codes are 56387 and [phone] TC modifier.  He may can reach out to his insurance to check this.  ?Thank you ? ?I spoke with patient who reports he has a history of knee problems.  Has flare ups at times.  Injured his knee on Sunday and was unable to walk on treadmill.  He will call back if he wants to reschedule.  I gave patient information from billing department.  Will send CPT codes to him through my chart so he can check with his insurance.  ?

## 2021-10-31 NOTE — Telephone Encounter (Signed)
Patient cancelled Myoview for reason below: ? ?10/30/2021 10:11 AM By: ?ZO:XWRUEA, MOLLIE W  ?Cancel Rsn: Patient (Hurt his knee and could not do the test. Also worried about price of test.) ? ?Order will be removed form the active Nuc WQ and if patint calls back to reschedule we will reinstate the orders. Thank you.  ?

## 2021-11-12 DIAGNOSIS — Z6833 Body mass index (BMI) 33.0-33.9, adult: Secondary | ICD-10-CM | POA: Diagnosis not present

## 2021-11-12 DIAGNOSIS — R030 Elevated blood-pressure reading, without diagnosis of hypertension: Secondary | ICD-10-CM | POA: Diagnosis not present

## 2021-11-12 DIAGNOSIS — E669 Obesity, unspecified: Secondary | ICD-10-CM | POA: Diagnosis not present

## 2021-11-17 ENCOUNTER — Other Ambulatory Visit: Payer: Self-pay | Admitting: Family Medicine

## 2022-03-19 ENCOUNTER — Other Ambulatory Visit: Payer: Self-pay | Admitting: Family Medicine

## 2022-05-21 ENCOUNTER — Encounter: Payer: Self-pay | Admitting: *Deleted

## 2022-07-06 IMAGING — CT CT CARDIAC CORONARY ARTERY CALCIUM SCORE
3 series · 14 of 20 positions shown, 16 images · non-contrast
Comparison: 08/28/2005.
COMPARISON: 08/28/2005.

Addendum:
EXAM:
OVER-READ INTERPRETATION  CT CHEST

The following report is an over-read performed by radiologist Dr.
Bambucafe Tarla [REDACTED] on 09/29/2021. This
over-read does not include interpretation of cardiac or coronary
anatomy or pathology. The coronary calcium score/coronary CTA
interpretation by the cardiologist is attached.
CLINICAL DATA: Cardiovascular Disease Risk stratification
Coronary Calcium Score
TECHNIQUE: A gated, non-contrast computed tomography scan of the heart was
performed using 3mm slice thickness. Axial images were analyzed on a
dedicated workstation. Calcium scoring of the coronary arteries was
performed using the Agatston method.

[Series 2: cascseq 2.0 sa36 70% (id) · axial · 0.42mm/px · z∈[-217,-127]mm · 4 of 76 slices shown]
[im 16/76  vessel]
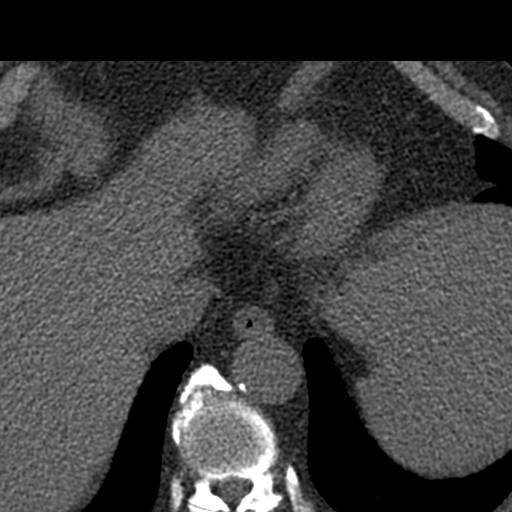
[im 31/76  vessel]
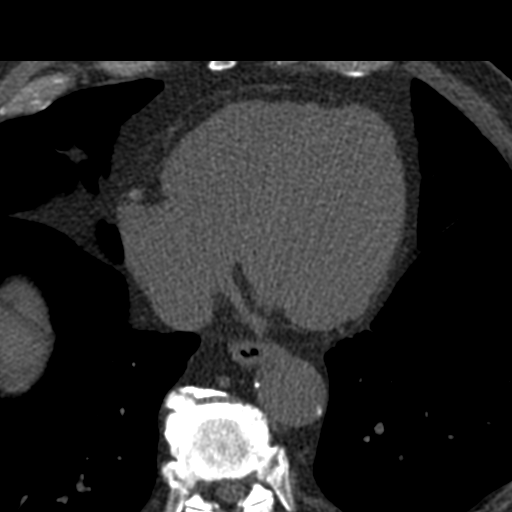
[im 46/76  vessel]
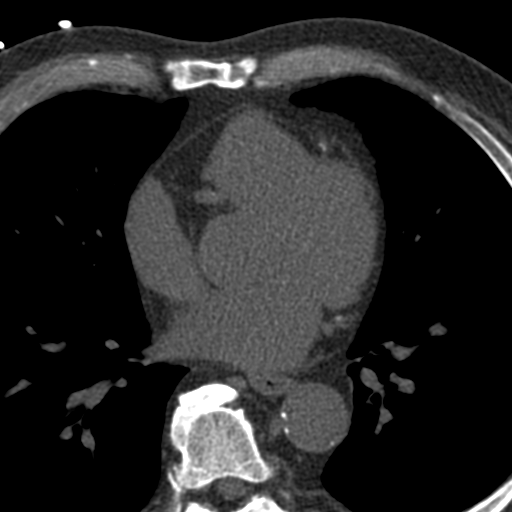
[im 61/76  vessel]
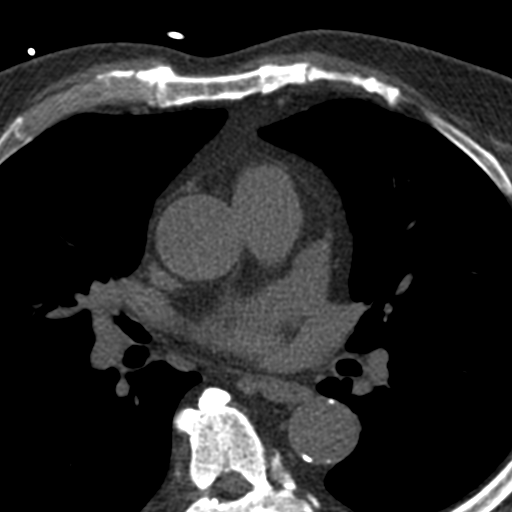

[Series 3: cascseq 2.0 bf37 st · axial · 0.70mm/px · z∈[-223,-123]mm · 5 of 76 slices shown, 7 images]
[im 13/76  vessel]
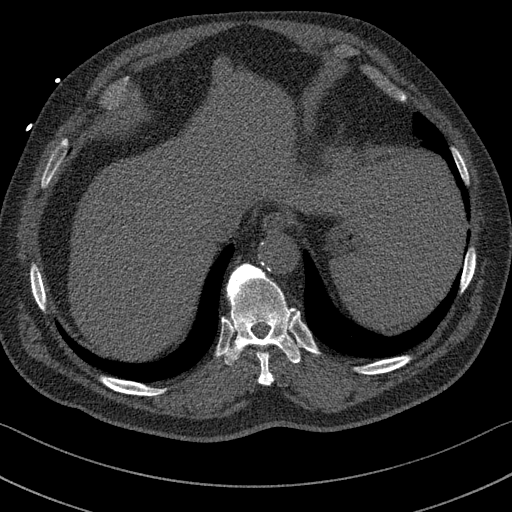
[im 13/76  lung]
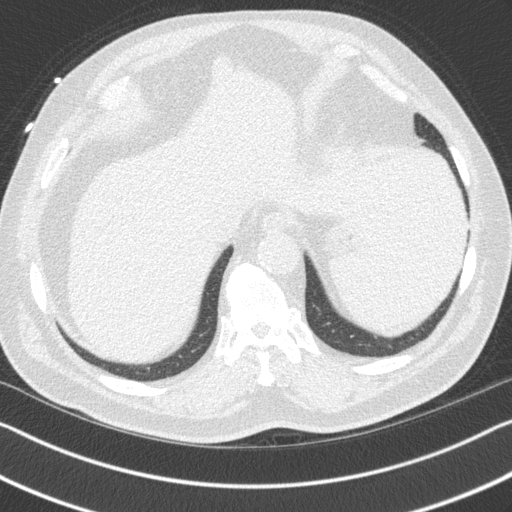
[im 26/76  vessel]
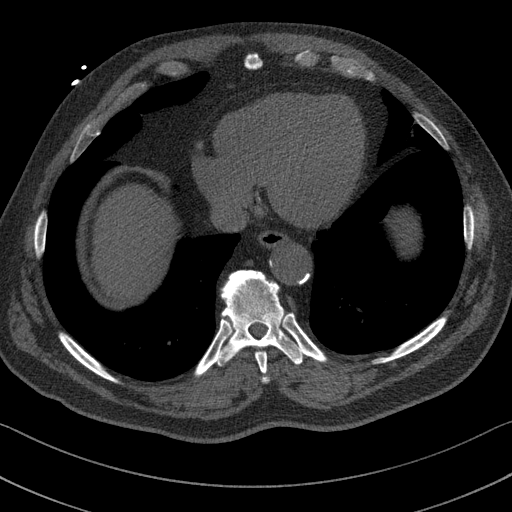
[im 38/76  vessel]
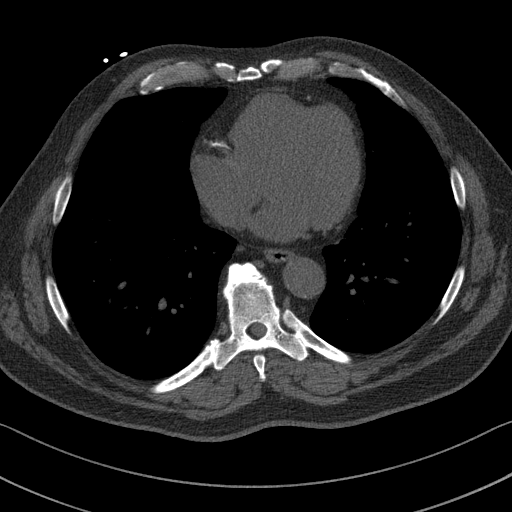
[im 51/76  vessel]
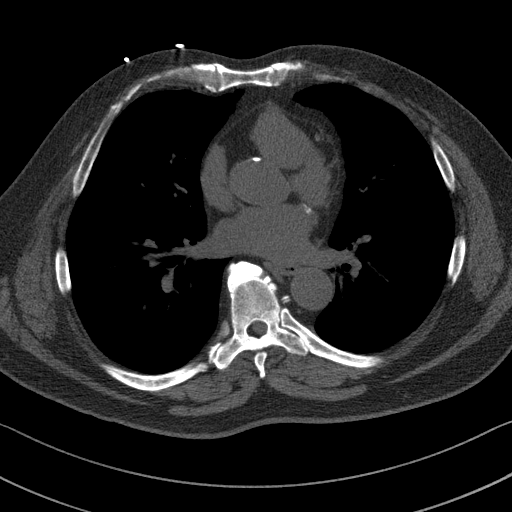
[im 63/76  vessel]
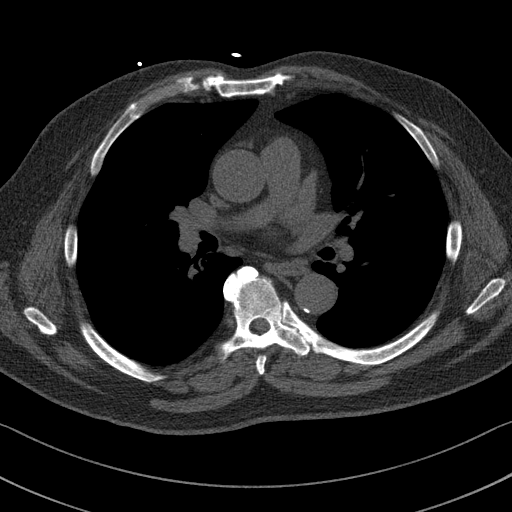
[im 63/76  lung]
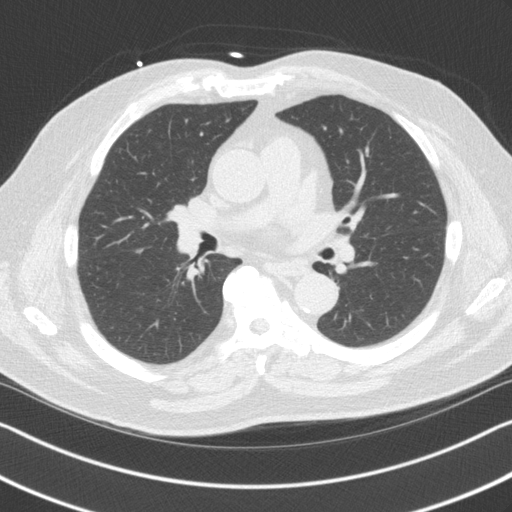

[Series 4: cascseq 2.0 br59 lung · axial · 0.70mm/px · z∈[-223,-123]mm · 5 of 76 slices shown]
[im 13/76  lung]
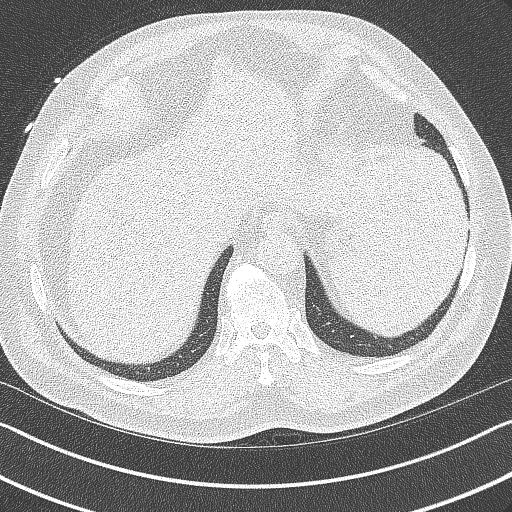
[im 26/76  lung]
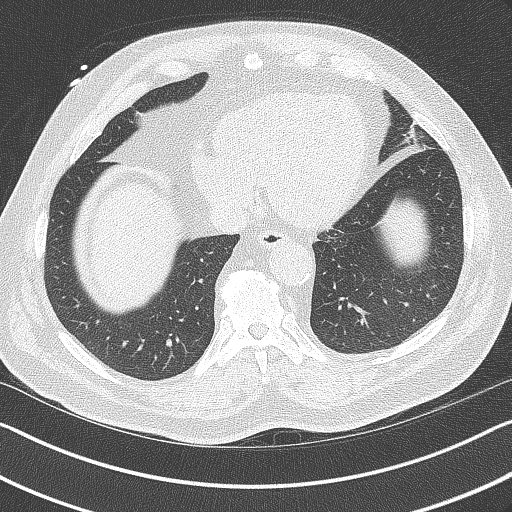
[im 38/76  lung]
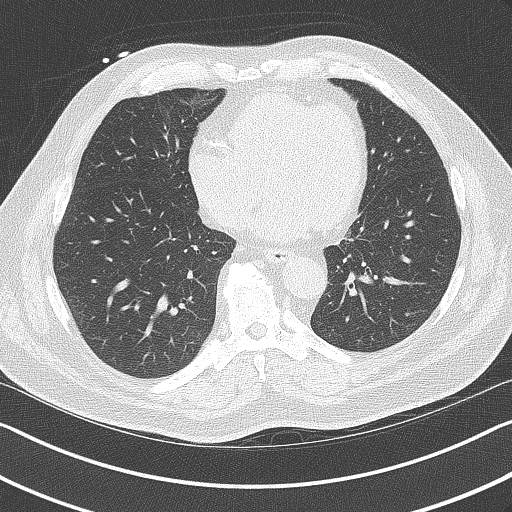
[im 51/76  lung]
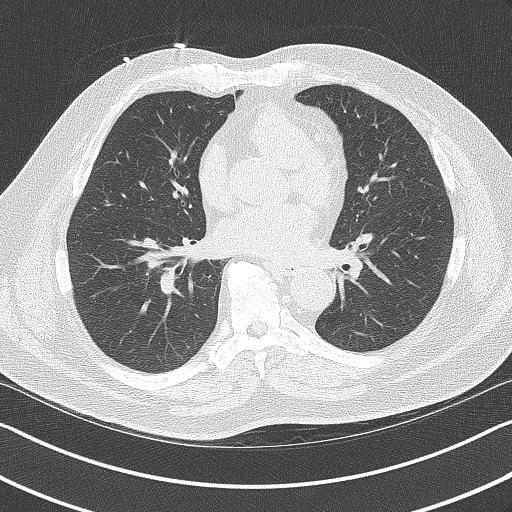
[im 63/76  lung]
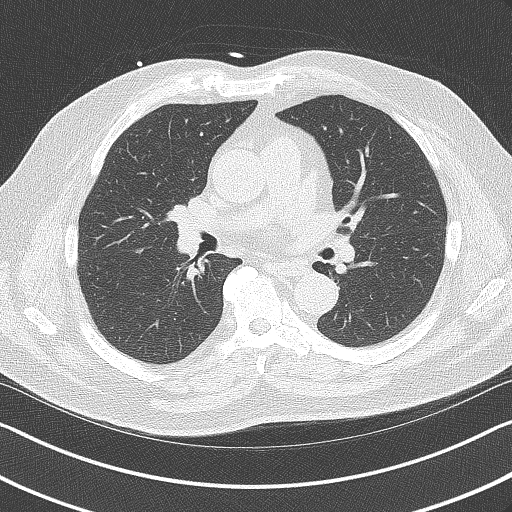

[14 of 20 positions shown; findings below may reference images not displayed]

FINDINGS: Vascular: Atherosclerotic calcification of the aorta.

Mediastinum/Nodes: None.

Lungs/Pleura: 3 mm right middle lobe nodule ([DATE]), unchanged and
benign. Scattered pulmonary parenchymal scarring. Polygonal shaped 6
mm left lower lobe nodule (4/32), unchanged and benign. No pleural
fluid.

Upper Abdomen: None.

Musculoskeletal: Degenerative changes in the spine.
IMPRESSION: 1. No acute extracardiac findings.
2.  Aortic atherosclerosis (ZJWHS-MLP.P).
FINDINGS: Coronary arteries: Normal origins.

Coronary Calcium Score:

Left main: 0

Left anterior descending artery: 482

Left circumflex artery: 148

Right coronary artery: 549

Total: 4424

Percentile: 96th

Pericardium: Normal.

Ascending Aorta: Normal caliber.  Scattered calcifications.

Non-cardiac: See separate report from [REDACTED].
IMPRESSION: Coronary calcium score of 4424. This was 96th percentile for age-,
race-, and sex-matched controls.



If CAC=0, it is reasonable to withhold statin therapy and reassess
in 5 to 10 years, as long as higher risk conditions are absent
(diabetes mellitus, family history of premature CHD in first degree
relatives (males <55 years; females <65 years), cigarette smoking,
or LDL >=190 mg/dL).

If CAC is 1 to 99, it is reasonable to initiate statin therapy for
patients >=55 years of age.

If CAC is >=100 or >=75th percentile, it is reasonable to initiate
statin therapy at any age.

Cardiology referral should be considered for patients with CAC
scores >=400 or >=75th percentile.

*7839 AHA/ACC/AACVPR/AAPA/ABC/BOBADE/SASHA/EASTER/Rashad/SET/SILVER/RESTAN
Guideline on the Management of Blood Cholesterol: A Report of the
American College of Cardiology/American Heart Association Task Force
on Clinical Practice Guidelines. J Am Coll Cardiol.
3514;73(24):2829-2282.

*** End of Addendum ***
EXAM:
OVER-READ INTERPRETATION  CT CHEST

The following report is an over-read performed by radiologist Dr.
Bambucafe Tarla [REDACTED] on 09/29/2021. This
over-read does not include interpretation of cardiac or coronary
anatomy or pathology. The coronary calcium score/coronary CTA
interpretation by the cardiologist is attached.
FINDINGS: Vascular: Atherosclerotic calcification of the aorta.

Mediastinum/Nodes: None.

Lungs/Pleura: 3 mm right middle lobe nodule ([DATE]), unchanged and
benign. Scattered pulmonary parenchymal scarring. Polygonal shaped 6
mm left lower lobe nodule (4/32), unchanged and benign. No pleural
fluid.

Upper Abdomen: None.

Musculoskeletal: Degenerative changes in the spine.
IMPRESSION: 1. No acute extracardiac findings.
2.  Aortic atherosclerosis (ZJWHS-MLP.P).

## 2022-08-09 ENCOUNTER — Encounter: Payer: Self-pay | Admitting: *Deleted

## 2022-08-12 ENCOUNTER — Other Ambulatory Visit: Payer: Self-pay | Admitting: Family Medicine

## 2022-09-03 ENCOUNTER — Other Ambulatory Visit: Payer: Self-pay | Admitting: Family Medicine

## 2022-09-03 ENCOUNTER — Ambulatory Visit (INDEPENDENT_AMBULATORY_CARE_PROVIDER_SITE_OTHER): Payer: BC Managed Care – PPO | Admitting: Family Medicine

## 2022-09-03 ENCOUNTER — Encounter: Payer: Self-pay | Admitting: Family Medicine

## 2022-09-03 VITALS — BP 118/68 | HR 71 | Temp 97.8°F | Ht 68.0 in | Wt 214.4 lb

## 2022-09-03 DIAGNOSIS — E785 Hyperlipidemia, unspecified: Secondary | ICD-10-CM

## 2022-09-03 DIAGNOSIS — I10 Essential (primary) hypertension: Secondary | ICD-10-CM | POA: Diagnosis not present

## 2022-09-03 DIAGNOSIS — Z1159 Encounter for screening for other viral diseases: Secondary | ICD-10-CM

## 2022-09-03 DIAGNOSIS — I2510 Atherosclerotic heart disease of native coronary artery without angina pectoris: Secondary | ICD-10-CM | POA: Insufficient documentation

## 2022-09-03 DIAGNOSIS — Z114 Encounter for screening for human immunodeficiency virus [HIV]: Secondary | ICD-10-CM | POA: Diagnosis not present

## 2022-09-03 DIAGNOSIS — I2583 Coronary atherosclerosis due to lipid rich plaque: Secondary | ICD-10-CM

## 2022-09-03 DIAGNOSIS — Z0001 Encounter for general adult medical examination with abnormal findings: Secondary | ICD-10-CM

## 2022-09-03 DIAGNOSIS — R972 Elevated prostate specific antigen [PSA]: Secondary | ICD-10-CM | POA: Diagnosis not present

## 2022-09-03 DIAGNOSIS — R739 Hyperglycemia, unspecified: Secondary | ICD-10-CM | POA: Diagnosis not present

## 2022-09-03 DIAGNOSIS — G8929 Other chronic pain: Secondary | ICD-10-CM

## 2022-09-03 DIAGNOSIS — R931 Abnormal findings on diagnostic imaging of heart and coronary circulation: Secondary | ICD-10-CM | POA: Diagnosis not present

## 2022-09-03 LAB — LIPID PANEL
Cholesterol: 126 mg/dL (ref 0–200)
HDL: 40.2 mg/dL (ref >39.00)
LDL Cholesterol: 60 mg/dL (ref 0–99)
NonHDL: 86.09
Total CHOL/HDL Ratio: 3
Triglycerides: 129 mg/dL (ref 0.0–149.0)
VLDL: 25.8 mg/dL (ref 0.0–40.0)

## 2022-09-03 LAB — COMPREHENSIVE METABOLIC PANEL
ALT: 24 U/L (ref 0–53)
AST: 20 U/L (ref 0–37)
Albumin: 4.9 g/dL (ref 3.5–5.2)
Alkaline Phosphatase: 63 U/L (ref 39–117)
BUN: 10 mg/dL (ref 6–23)
CO2: 26 mEq/L (ref 19–32)
Calcium: 9.6 mg/dL (ref 8.4–10.5)
Chloride: 100 mEq/L (ref 96–112)
Creatinine, Ser: 0.81 mg/dL (ref 0.40–1.50)
GFR: 93.57 mL/min (ref >60.00)
Glucose, Bld: 106 mg/dL — ABNORMAL HIGH (ref 70–99)
Potassium: 4.1 mEq/L (ref 3.5–5.1)
Sodium: 137 mEq/L (ref 135–145)
Total Bilirubin: 0.7 mg/dL (ref 0.2–1.2)
Total Protein: 7.7 g/dL (ref 6.0–8.3)

## 2022-09-03 LAB — CBC
HCT: 42.9 % (ref 39.0–52.0)
Hemoglobin: 14.7 g/dL (ref 13.0–17.0)
MCHC: 34.3 g/dL (ref 30.0–36.0)
MCV: 95.1 fl (ref 78.0–100.0)
Platelets: 224 10*3/uL (ref 150.0–400.0)
RBC: 4.51 Mil/uL (ref 4.22–5.81)
RDW: 12.6 % (ref 11.5–15.5)
WBC: 8.6 10*3/uL (ref 4.0–10.5)

## 2022-09-03 LAB — HEMOGLOBIN A1C: Hgb A1c MFr Bld: 6.3 % (ref 4.6–6.5)

## 2022-09-03 LAB — TSH: TSH: 3.16 u[IU]/mL (ref 0.35–5.50)

## 2022-09-03 LAB — PSA: PSA: 1.71 ng/mL (ref 0.10–4.00)

## 2022-09-03 MED ORDER — HYDROCODONE-ACETAMINOPHEN 5-325 MG PO TABS: 1.0000 | ORAL_TABLET | Freq: Four times a day (QID) | ORAL | 0 refills | Status: DC | PRN

## 2022-09-03 NOTE — Assessment & Plan Note (Signed)
Last PSA was at goal.  Will repeat today.  Has previously seen urology however he was told he no longer needs to follow-up with them.

## 2022-09-03 NOTE — Assessment & Plan Note (Signed)
Coronary artery calcium score at the 96th percentile last year.  He has done well with higher dose of Lipitor 80 mg daily.  He did see cardiology however he was not able to complete stress testing due to financial concerns.  Recommend he start low-dose aspirin daily.  We can repeat coronary artery calcium score in a few years.  We discussed reasons to return to care or seek emergent care.

## 2022-09-03 NOTE — Assessment & Plan Note (Signed)
On Lipitor 80mg  daily and tolerating well.  Check labs today.

## 2022-09-03 NOTE — Patient Instructions (Addendum)
It was very nice to see you today!  We will check blood work today.  Please continue to work on diet and exercise.  Please take a baby aspirin daily.  Please check with your pharmacy about your vaccines.  I will see you back in a year for your next physical.  Come back sooner if needed.  Take care, Dr Jimmey Ralph  PLEASE NOTE:  If you had any lab tests, please let us know if you have not heard back within a few days. You may see your results on mychart before we have a chance to review them but we will give you a call once they are reviewed by [place].   If we ordered any referrals today, please let us know if you have not heard from their office within the next week.   If you had any urgent prescriptions sent in today, please check with the pharmacy within an hour of our visit to make sure the prescription was transmitted appropriately.   Please try these tips to maintain a healthy lifestyle:  Eat at least 3 REAL meals and 1-2 snacks per day.  Aim for no more than 5 hours between eating.  If you eat breakfast, please do so within one hour of getting up.   Each meal should contain half fruits/vegetables, one quarter protein, and one quarter carbs (no bigger than a computer mouse)  Cut down on sweet beverages. This includes juice, soda, and sweet tea.   Drink at least 1 glass of water with each meal and aim for at least 8 glasses per day  Exercise at least 150 minutes every week.    Preventive Care 3-49 Years Old, Male Preventive care refers to lifestyle choices and visits with your health care provider that can promote health and wellness. Preventive care visits are also called wellness exams. What can I expect for my preventive care visit? Counseling During your preventive care visit, your health care provider may ask about your: Medical history, including: Past medical problems. Family medical history. Current health, including: Emotional well-being. Home life and relationship  well-being. Sexual activity. Lifestyle, including: Alcohol, nicotine or tobacco, and drug use. Access to firearms. Diet, exercise, and sleep habits. Safety issues such as seatbelt and bike helmet use. Sunscreen use. Work and work Astronomer. Physical exam Your health care provider will check your: Height and weight. These may be used to calculate your BMI (body mass index). BMI is a measurement that tells if you are at a healthy weight. Waist circumference. This measures the distance around your waistline. This measurement also tells if you are at a healthy weight and may help predict your risk of certain diseases, such as type 2 diabetes and high blood pressure. Heart rate and blood pressure. Body temperature. Skin for abnormal spots. What immunizations do I need?  Vaccines are usually given at various ages, according to a schedule. Your health care provider will recommend vaccines for you based on your age, medical history, and lifestyle or other factors, such as travel or where you work. What tests do I need? Screening Your health care provider may recommend screening tests for certain conditions. This may include: Lipid and cholesterol levels. Diabetes screening. This is done by checking your blood sugar (glucose) after you have not eaten for a while (fasting). Hepatitis B test. Hepatitis C test. HIV (human immunodeficiency virus) test. STI (sexually transmitted infection) testing, if you are at risk. Lung cancer screening. Prostate cancer screening. Colorectal cancer screening. Talk with your health  care provider about your test results, treatment options, and if necessary, the need for more tests. Follow these instructions at home: Eating and drinking  Eat a diet that includes fresh fruits and vegetables, whole grains, lean protein, and low-fat dairy products. Take vitamin and mineral supplements as recommended by your health care provider. Do not drink alcohol if your  health care provider tells you not to drink. If you drink alcohol: Limit how much you have to 0-2 drinks a day. Know how much alcohol is in your drink. In the [place] one drink equals one 12 oz bottle of beer (355 mL), one 5 oz glass of wine (148 mL), or one 1 oz glass of hard liquor (44 mL). Lifestyle Brush your teeth every morning and night with fluoride toothpaste. Floss one time each day. Exercise for at least 30 minutes 5 or more days each week. Do not use any products that contain nicotine or tobacco. These products include cigarettes, chewing tobacco, and vaping devices, such as e-cigarettes. If you need help quitting, ask your health care provider. Do not use drugs. If you are sexually active, practice safe sex. Use a condom or other form of protection to prevent STIs. Take aspirin only as told by your health care provider. Make sure that you understand how much to take and what form to take. Work with your health care provider to find out whether it is safe and beneficial for you to take aspirin daily. Find healthy ways to manage stress, such as: Meditation, yoga, or listening to music. Journaling. Talking to a trusted person. Spending time with friends and family. Minimize exposure to UV radiation to reduce your risk of skin cancer. Safety Always wear your seat belt while driving or riding in a vehicle. Do not drive: If you have been drinking alcohol. Do not ride with someone who has been drinking. When you are tired or distracted. While texting. If you have been using any mind-altering substances or drugs. Wear a helmet and other protective equipment during sports activities. If you have firearms in your house, make sure you follow all gun safety procedures. What's next? Go to your health care provider once a year for an annual wellness visit. Ask your health care provider how often you should have your eyes and teeth checked. Stay up to date on all vaccines. This information  is not intended to replace advice given to you by your health care provider. Make sure you discuss any questions you have with your health care provider. Document Revised: 02/08/2021 Document Reviewed: 02/08/2021 Elsevier Patient Education  [employer].

## 2022-09-03 NOTE — Assessment & Plan Note (Signed)
Check A1c with labs. 

## 2022-09-03 NOTE — Assessment & Plan Note (Signed)
Database reviewed without red flags.  He has been using Norco 5-3 25 very sparingly as needed.  His last refill was over a year ago.  Medication helps with ability to stay active and perform activities of daily living.  He has not had any significant side effects.  Will refill today.  Follow-up in 3 months for refills.

## 2022-09-03 NOTE — Progress Notes (Signed)
Chief Complaint:  Danny Wilkerson is a 62 y.o. male who presents today for his annual comprehensive physical exam.    Assessment/Plan:  Chronic Problems Addressed Today: Dyslipidemia On Lipitor 80mg  daily and tolerating well.  Check labs today.  Benign essential hypertension Blood pressure at goal on amlodipine-valsartan 5-160 once daily.  Check labs.   Elevated PSA Last PSA was at goal.  Will repeat today.  Has previously seen urology however he was told he no longer needs to follow-up with them.  Hyperglycemia Check A1c with labs.  Chronic pain Database reviewed without red flags.  He has been using Norco 5-3 25 very sparingly as needed.  His last refill was over a year ago.  Medication helps with ability to stay active and perform activities of daily living.  He has not had any significant side effects.  Will refill today.  Follow-up in 3 months for refills.   Coronary artery disease Coronary artery calcium score at the 96th percentile last year.  He has done well with higher dose of Lipitor 80 mg daily.  He did see cardiology however he was not able to complete stress testing due to financial concerns.  Recommend he start low-dose aspirin daily.  We can repeat coronary artery calcium score in a few years.  We discussed reasons to return to care or seek emergent care.  Preventative Healthcare: Check labs.  He will check with pharmacy about getting vaccines updated.  Up-to-date on colonoscopy.  Patient Counseling(The following topics were reviewed and/or handout was given):  -Nutrition: Stressed importance of moderation in sodium/caffeine intake, saturated fat and cholesterol, caloric balance, sufficient intake of fresh fruits, vegetables, and fiber.  -Stressed the importance of regular exercise.   -Substance Abuse: Discussed cessation/primary prevention of tobacco, alcohol, or other drug use; driving or other dangerous activities under the influence; availability of treatment  for abuse.   -Injury prevention: Discussed safety belts, safety helmets, smoke detector, smoking near bedding or upholstery.   -Sexuality: Discussed sexually transmitted diseases, partner selection, use of condoms, avoidance of unintended pregnancy and contraceptive alternatives.   -Dental health: Discussed importance of regular tooth brushing, flossing, and dental visits.  -Health maintenance and immunizations reviewed. Please refer to Health maintenance section.  Return to care in 1 year for next preventative visit.     Subjective:  HPI:  He has no acute complaints today.  Patient was seen a year ago for annual physical.  Done well over the last year.  We did get a coronary artery calcium score last year which was extremely elevated.  He was referred to cardiology.  We increase his Lipitor to 80 mg daily.  He has been doing well with this.  Cardiology recommend he get stress testing however patient was not able to have this performed due to financial concerns.  Lifestyle Diet: Balanced.  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      09/03/2022    9:01 AM  Depression screen PHQ 2/9  Decreased Interest 0  Down, Depressed, Hopeless 0  PHQ - 2 Score 0    Health Maintenance Due  Topic Date Due   Hepatitis C Screening  Never done   Zoster Vaccines- Shingrix (2 of 2) 05/22/2020   DTaP/Tdap/Td (2 - Td or Tdap) 10/24/2020     ROS: Per HPI, otherwise a complete review of systems was negative.   PMH:  The following were reviewed and entered/updated in epic: Past Medical History:  Diagnosis Date   Anxiety  Arthritis    Hypertension    Sleep apnea    Patient Active Problem List   Diagnosis Date Noted   Coronary artery disease 09/03/2022   Generalized anxiety disorder 08/30/2021   Chronic pain 08/30/2021   Elevated PSA 08/30/2020   Dyslipidemia 08/30/2020   Hyperglycemia 08/30/2020   Tinnitus 08/29/2020   GERD (gastroesophageal reflux disease) 08/29/2020    Arthritis    Benign essential hypertension 09/24/2013   History reviewed. No pertinent surgical history.  Family History  Problem Relation Age of Onset   Arthritis Mother    Cancer Mother    Depression Mother    Hearing loss Mother    Arthritis Father    COPD Father    Hearing loss Father    Heart attack Brother     Medications- reviewed and updated Current Outpatient Medications  Medication Sig Dispense Refill   amLODipine-valsartan (EXFORGE) 5-160 MG tablet TAKE 1 TABLET BY MOUTH EVERY DAY 90 tablet 3   atorvastatin (LIPITOR) 80 MG tablet TAKE 1 TABLET BY MOUTH EVERY DAY 90 tablet 0   colchicine 0.6 MG tablet Take 1 tablet by mouth 2 (two) times daily as needed. Gout flare up     omeprazole (PRILOSEC) 40 MG capsule Take 40 mg by mouth daily.     HYDROcodone-acetaminophen (NORCO/VICODIN) 5-325 MG tablet Take 1 tablet by mouth every 6 (six) hours as needed. 120 tablet 0   No current facility-administered medications for this visit.    Allergies-reviewed and updated Allergies  Allergen Reactions   Prednisone Other (See Comments)    SUICIDAL   Ampicillin Other (See Comments)    Childhood reaction   Meperidine Nausea And Vomiting    Social History   Socioeconomic History   Marital status: Divorced    Spouse name: Not on file   Number of children: Not on file   Years of education: Not on file   Highest education level: Not on file  Occupational History   Not on file  Tobacco Use   Smoking status: Never   Smokeless tobacco: Not on file  Substance and Sexual Activity   Alcohol use: Yes    Alcohol/week: 12.0 standard drinks of alcohol    Types: 12 Cans of beer per week   Drug use: Never   Sexual activity: Not Currently  Other Topics Concern   Not on file  Social History Narrative   Not on file   Social Determinants of Health   Financial Resource Strain: Not on file  Food Insecurity: Not on file  Transportation Needs: Not on file  Physical Activity: Not on  file  Stress: Not on file  Social Connections: Not on file        Objective:  Physical Exam: BP 118/68   Pulse 71   Temp 97.8 F (36.6 C) (Temporal)   Ht 5\' 8"  (1.727 m)   Wt 214 lb 6.4 oz (97.3 kg)   SpO2 99%   BMI 32.60 kg/m   Body mass index is 32.6 kg/m. Wt Readings from Last 3 Encounters:  09/03/22 214 lb 6.4 oz (97.3 kg)  10/24/21 217 lb (98.4 kg)  08/30/21 213 lb 3.2 oz (96.7 kg)   Gen: NAD, resting comfortably HEENT: TMs normal bilaterally. OP clear. No thyromegaly noted.  CV: RRR with no murmurs appreciated Pulm: NWOB, CTAB with no crackles, wheezes, or rhonchi GI: Normal bowel sounds present. Soft, Nontender, Nondistended. MSK: no edema, cyanosis, or clubbing noted Skin: warm, dry Neuro: CN2-12 grossly intact. Strength 5/5 in  upper and lower extremities. Reflexes symmetric and intact bilaterally.  Psych: Normal affect and thought content   EKG: NSR. No ischemic changes.       Katina Degree. Jimmey Ralph, MD 09/03/2022 9:43 AM

## 2022-09-03 NOTE — Assessment & Plan Note (Signed)
Blood pressure at goal on amlodipine-valsartan 5-160 once daily.  Check labs.

## 2022-09-04 ENCOUNTER — Telehealth: Payer: Self-pay | Admitting: *Deleted

## 2022-09-04 LAB — HIV ANTIBODY (ROUTINE TESTING W REFLEX): HIV 1&2 Ab, 4th Generation: NONREACTIVE

## 2022-09-04 LAB — HEPATITIS C ANTIBODY: Hepatitis C Ab: NONREACTIVE

## 2022-09-04 NOTE — Telephone Encounter (Signed)
(  Key: [id number]) - [id number] HYDROcodone-Acetaminophen 5-325MG  tablets Status: PA Request Sent: January 9th, 2024 Waiting for determination

## 2022-09-05 NOTE — Telephone Encounter (Signed)
Approved on January 9 Effective from 09/04/2022 through 10/03/2022.

## 2022-09-05 NOTE — Progress Notes (Signed)
Please inform patient of the following:  His cholesterol levels look good-he should continue current treatment plan for this.  His blood sugar is in the borderline diabetic range.  If he is interested in treatment for this recommend he come in to schedule appointment to discuss.  Otherwise he should continue to work on diet and exercise and we can recheck this in a year or so.  Everything else was normal and we can recheck in a year.

## 2023-01-22 ENCOUNTER — Ambulatory Visit: Payer: BC Managed Care – PPO | Admitting: Family Medicine

## 2023-02-03 ENCOUNTER — Other Ambulatory Visit: Payer: Self-pay | Admitting: Family Medicine

## 2023-02-27 ENCOUNTER — Ambulatory Visit (INDEPENDENT_AMBULATORY_CARE_PROVIDER_SITE_OTHER): Payer: BC Managed Care – PPO | Admitting: Family Medicine

## 2023-02-27 ENCOUNTER — Encounter: Payer: Self-pay | Admitting: Family Medicine

## 2023-02-27 VITALS — BP 130/66 | HR 65 | Temp 98.0°F | Ht 68.0 in | Wt 213.0 lb

## 2023-02-27 DIAGNOSIS — L820 Inflamed seborrheic keratosis: Secondary | ICD-10-CM

## 2023-02-27 DIAGNOSIS — L570 Actinic keratosis: Secondary | ICD-10-CM

## 2023-02-27 DIAGNOSIS — L819 Disorder of pigmentation, unspecified: Secondary | ICD-10-CM

## 2023-02-27 DIAGNOSIS — G8929 Other chronic pain: Secondary | ICD-10-CM | POA: Diagnosis not present

## 2023-02-27 MED ORDER — HYDROCODONE-ACETAMINOPHEN 5-325 MG PO TABS: 1.0000 | ORAL_TABLET | Freq: Four times a day (QID) | ORAL | 0 refills | Status: DC | PRN

## 2023-02-27 NOTE — Patient Instructions (Signed)
It was very nice to see you today!  We biopsied the lesion on your chest today.  Please let us know if you have any complications from this.  I will refill your medications today.  Return in about 6 months (around 08/30/2023) for Annual Physical.   Take care, Dr Jimmey Ralph  PLEASE NOTE:  If you had any lab tests, please let us know if you have not heard back within a few days. You may see your results on mychart before we have a chance to review them but we will give you a call once they are reviewed by [place].   If we ordered any referrals today, please let us know if you have not heard from their office within the next week.   If you had any urgent prescriptions sent in today, please check with the pharmacy within an hour of our visit to make sure the prescription was transmitted appropriately.   Please try these tips to maintain a healthy lifestyle:  Eat at least 3 REAL meals and 1-2 snacks per day.  Aim for no more than 5 hours between eating.  If you eat breakfast, please do so within one hour of getting up.   Each meal should contain half fruits/vegetables, one quarter protein, and one quarter carbs (no bigger than a computer mouse)  Cut down on sweet beverages. This includes juice, soda, and sweet tea.   Drink at least 1 glass of water with each meal and aim for at least 8 glasses per day  Exercise at least 150 minutes every week.

## 2023-02-27 NOTE — Progress Notes (Signed)
   Danny Wilkerson is a 62 y.o. male who presents today for an office visit.  Assessment/Plan:  New/Acute Problems: Pigmented Skin Lesion  Possibly seborrheic keratosis however area is irritated and difficult to make definitive diagnosis.  Shave biopsy was performed today.  See below procedure note.  He tolerated well.  Chronic Problems Addressed Today: No problem-specific Assessment & Plan notes found for this encounter.     Subjective:  HPI:  See Assessment / plan for status of chronic conditions.   His main concern today is skin lesion on right shoulder.  This is been present for a few years.  Sometimes gets irritated.  Sometimes picks at the area.  No bleeding.  Has been the same size for the last several weeks.  He also has had a lesion on his right forehead.  This is also been present for several years.  We froze this a few years ago.  He has not had any significant change since then.  Occasionally scabs up.        Objective:  Physical Exam: BP 130/66   Pulse 65   Temp 98 F (36.7 C) (Temporal)   Ht 5\' 8"  (1.727 m)   Wt 213 lb (96.6 kg)   SpO2 99%   BMI 32.39 kg/m   Gen: No acute distress, resting comfortably Skin: 3 to 4 mm keratotic lesion on right anterior forehead.  Small amount of surrounding erythema.  10 x 15 mm pigmented keratotic lesion on right anterior chest and shoulder. Neuro: Grossly normal, moves all extremities Psych: Normal affect and thought content  Shave Biopsy Procedure Note  Pre-operative Diagnosis: Suspicious lesion  Post-operative Diagnosis: same  Locations:Right Anterior chest  Indications: Diagnostic  Anesthesia: Lidocaine 1% with epinephrine without added sodium bicarbonate  Procedure Details  History of allergy to iodine: no  Patient informed of the risks (including bleeding and infection) and benefits of the  procedure and Verbal informed consent obtained.  The lesion and surrounding area were given a sterile prep using  needle gets and draped in the usual sterile fashion. A scalpel was used to shave an area of skin approximately 1cm by 2cm.  Hemostasis achieved with silver nitrate and pressure. Antibiotic ointment and a sterile dressing applied.  The specimen was sent for pathologic examination. The patient tolerated the procedure well.  EBL: 1 ml  Findings: Skin lesion  Condition: Stable  Complications: none.  Plan: 1. Instructed to keep the wound dry and covered for 24-48h and clean thereafter. 2. Warning signs of infection were reviewed.   3. Recommended that the patient use OTC analgesics as needed for pain.       Katina Degree. Jimmey Ralph, MD 02/27/2023 9:17 AM

## 2023-02-27 NOTE — Assessment & Plan Note (Signed)
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  We did discuss repeating cryotherapy vs referral to dermatology for excision however he declined for now.  He would like to continue with watchful waiting.  Will follow-up again in 3 to 6 months.  He will let us know if lesion changes in the interim.

## 2023-02-27 NOTE — Assessment & Plan Note (Signed)
Database no red flags.  On Norco 5-325 sparingly as needed.  Last refill was about 6 months ago.  Will refill today.  Medications help with ability stay active and perform activities of daily living.  No significant side effects.  Follow-up in 3 to 6 months.

## 2023-03-07 LAB — DERMATOLOGY PATHOLOGY

## 2023-03-08 ENCOUNTER — Telehealth: Payer: Self-pay | Admitting: Family Medicine

## 2023-03-08 NOTE — Telephone Encounter (Signed)
Patient requests to be called to discuss Biopsy results received on MyChart

## 2023-03-08 NOTE — Progress Notes (Signed)
His biopsy showed an irritated seborrheic keratosis.  This is benign and should not cause him any further issues.  Do not need to do any other testing at this point.  He should let us know if any new issues arise.

## 2023-03-12 NOTE — Telephone Encounter (Signed)
See results note. 

## 2023-05-03 ENCOUNTER — Other Ambulatory Visit: Payer: Self-pay | Admitting: Family Medicine

## 2023-05-14 ENCOUNTER — Ambulatory Visit (INDEPENDENT_AMBULATORY_CARE_PROVIDER_SITE_OTHER): Admitting: Audiology

## 2023-08-01 ENCOUNTER — Other Ambulatory Visit: Payer: Self-pay | Admitting: Family Medicine

## 2023-09-05 ENCOUNTER — Encounter: Payer: BC Managed Care – PPO | Admitting: Family Medicine

## 2023-10-02 DIAGNOSIS — Z01818 Encounter for other preprocedural examination: Secondary | ICD-10-CM | POA: Diagnosis not present

## 2023-10-07 ENCOUNTER — Ambulatory Visit (INDEPENDENT_AMBULATORY_CARE_PROVIDER_SITE_OTHER): Payer: Medicare Other | Admitting: Family Medicine

## 2023-10-07 ENCOUNTER — Encounter: Payer: Self-pay | Admitting: Family Medicine

## 2023-10-07 VITALS — BP 138/81 | HR 76 | Temp 97.1°F | Ht 68.0 in | Wt 218.0 lb

## 2023-10-07 DIAGNOSIS — G4733 Obstructive sleep apnea (adult) (pediatric): Secondary | ICD-10-CM | POA: Diagnosis not present

## 2023-10-07 DIAGNOSIS — R972 Elevated prostate specific antigen [PSA]: Secondary | ICD-10-CM | POA: Diagnosis not present

## 2023-10-07 DIAGNOSIS — I10 Essential (primary) hypertension: Secondary | ICD-10-CM | POA: Diagnosis not present

## 2023-10-07 DIAGNOSIS — L570 Actinic keratosis: Secondary | ICD-10-CM | POA: Diagnosis not present

## 2023-10-07 DIAGNOSIS — E785 Hyperlipidemia, unspecified: Secondary | ICD-10-CM

## 2023-10-07 DIAGNOSIS — I2510 Atherosclerotic heart disease of native coronary artery without angina pectoris: Secondary | ICD-10-CM

## 2023-10-07 DIAGNOSIS — I2583 Coronary atherosclerosis due to lipid rich plaque: Secondary | ICD-10-CM

## 2023-10-07 DIAGNOSIS — R739 Hyperglycemia, unspecified: Secondary | ICD-10-CM

## 2023-10-07 LAB — COMPREHENSIVE METABOLIC PANEL
ALT: 24 U/L (ref 0–53)
AST: 22 U/L (ref 0–37)
Albumin: 4.6 g/dL (ref 3.5–5.2)
Alkaline Phosphatase: 58 U/L (ref 39–117)
BUN: 12 mg/dL (ref 6–23)
CO2: 25 meq/L (ref 19–32)
Calcium: 9.5 mg/dL (ref 8.4–10.5)
Chloride: 100 meq/L (ref 96–112)
Creatinine, Ser: 0.81 mg/dL (ref 0.40–1.50)
GFR: 92.85 mL/min (ref >60.00)
Glucose, Bld: 94 mg/dL (ref 70–99)
Potassium: 4.5 meq/L (ref 3.5–5.1)
Sodium: 136 meq/L (ref 135–145)
Total Bilirubin: 0.7 mg/dL (ref 0.2–1.2)
Total Protein: 7.2 g/dL (ref 6.0–8.3)

## 2023-10-07 LAB — CBC
HCT: 43.5 % (ref 39.0–52.0)
Hemoglobin: 14.7 g/dL (ref 13.0–17.0)
MCHC: 33.9 g/dL (ref 30.0–36.0)
MCV: 97.7 fL (ref 78.0–100.0)
Platelets: 220 10*3/uL (ref 150.0–400.0)
RBC: 4.45 Mil/uL (ref 4.22–5.81)
RDW: 13 % (ref 11.5–15.5)
WBC: 8.2 10*3/uL (ref 4.0–10.5)

## 2023-10-07 LAB — LIPID PANEL
Cholesterol: 119 mg/dL (ref 0–200)
HDL: 55.7 mg/dL (ref >39.00)
LDL Cholesterol: 47 mg/dL (ref 0–99)
NonHDL: 63.07
Total CHOL/HDL Ratio: 2
Triglycerides: 81 mg/dL (ref 0.0–149.0)
VLDL: 16.2 mg/dL (ref 0.0–40.0)

## 2023-10-07 LAB — HEMOGLOBIN A1C: Hgb A1c MFr Bld: 5.8 % (ref 4.6–6.5)

## 2023-10-07 LAB — TSH: TSH: 2.94 u[IU]/mL (ref 0.35–5.50)

## 2023-10-07 LAB — PSA, MEDICARE: PSA: 2.45 ng/mL (ref 0.10–4.00)

## 2023-10-07 NOTE — Progress Notes (Signed)
 Danny Wilkerson is a 63 y.o. male who presents today for an office visit.  Assessment/Plan:  New/Acute Problems: Neck Spasm  Symptoms have resolved.  Likely due to degenerative changes in his neck.  He also did have some mild radiculopathy symptoms as well that have since resolved.  Overall reassuring exam today.  We did discuss further workup including referrals and imaging however he would like to hold off on this for now as we complete below workup.  He will let us  know if he has any change in symptoms and we can refer to orthopedics.  We discussed reasons to return to care and seek emergent care.  Chronic Problems Addressed Today: Coronary artery disease Coronary artery calcium  score at the 96 percentile 2 years ago.  He is currently on Lipitor 80 mg daily and aspirin 81 mg daily.  Will place referral for him to reestablish with cardiology.  Check labs today.  Actinic keratosis New lesion on anterior scalp concern for basal cell carcinoma.  Will place referral to dermatology.  Will likely needs Mohs surgery.  OSA (obstructive sleep apnea) Will place referral to sleep studies for reevaluation.  Benign essential hypertension Blood pressure at goal on amlodipine  valsartan  5-1 60 once daily.  Elevated PSA Check PSA.  Dyslipidemia Check lipids.  He is on Lipitor 80 mg daily.  Hyperglycemia Check A1c.     Subjective:  HPI:  See Assessment / plan for status of chronic conditions. He has a few concerns that he would like to discuss today.  He was last seen about 6 months ago.  Since her last visit he ran out of insurance for a month or so but is now back on insurance with Medicare.  He would like to have a few referrals placed today.  He has had ongoing issues with sun damage on the scalp.  He has had a few spots frozen a few years ago but has had a new open spot.  In the right side of his forehead.  This has been present for the last years.  He would like to see a dermatologist  for this.  Also has history of OSA.  He was diagnosed about 20-30 years ago.  He was previously using CPAP but needs new equipment.  He would like to be referred for a new sleep study.  He is due for upcoming colonoscopy.  His GI doctor recommended that he get labs done prior to this.  His primary concern today is heart health.  He had a coronary CT scan 2 years ago which showed elevated coronary disease.  We referred him to cardiology and started him on high-dose statin.  He was recommended to pursue stress testing however was not able to complete this due to financial concerns.  He has been consistent with his amlodipine .  More recently he had a spasm in his neck that lasted for a few days.  He discussed with friends and family who are concerned about potential A-fib.  Did not have any chest pain or tightness.  Does occasionally get numbness and tingling in his left arm.  Has known degenerative changes in his neck.  The symptoms have resolved.  No chest pain.  No shortness of breath.  No palpitations.       Objective:  Physical Exam: BP 138/81   Pulse 76   Temp (!) 97.1 F (36.2 C) (Temporal)   Ht 5\' 8"  (1.727 m)   Wt 218 lb (98.9 kg)   SpO2 99%  BMI 33.15 kg/m   Gen: No acute distress, resting comfortably CV: Regular rate and rhythm with no murmurs appreciated.  No peripheral edema.  No carotid bruits. Pulm: Normal work of breathing, clear to auscultation bilaterally with no crackles, wheezes, or rhonchi MUSCULOSKELETAL: Left arm without deformities.  Full range of motion throughout.  Tinel sign negative at wrist and medial epicondyle. Skin: Scattered actinic keratosis across scalp with ulcerated erythematous lesion approximately 1 cm diameter on right anterior forehead.  Neuro: Grossly normal, moves all extremities Psych: Normal affect and thought content  EKG: NSR. No ischemic changes.   Time Spent: 40 minutes of total time was spent on the date of the encounter performing the  following actions: chart review prior to seeing the patient including recent visits with me and specialists, obtaining history, performing a medically necessary exam, counseling on the treatment plan, placing orders, and documenting in our EHR.        Jinny Mounts. Daneil Dunker, MD 10/07/2023 10:15 AM

## 2023-10-07 NOTE — Assessment & Plan Note (Signed)
 Will place referral to sleep studies for reevaluation.

## 2023-10-07 NOTE — Assessment & Plan Note (Signed)
 Check A1c.

## 2023-10-07 NOTE — Assessment & Plan Note (Signed)
 Coronary artery calcium  score at the 96 percentile 2 years ago.  He is currently on Lipitor 80 mg daily and aspirin 81 mg daily.  Will place referral for him to reestablish with cardiology.  Check labs today.

## 2023-10-07 NOTE — Assessment & Plan Note (Signed)
Check lipids.  He is on Lipitor 80 mg daily. 

## 2023-10-07 NOTE — Assessment & Plan Note (Signed)
 New lesion on anterior scalp concern for basal cell carcinoma.  Will place referral to dermatology.  Will likely needs Mohs surgery.

## 2023-10-07 NOTE — Assessment & Plan Note (Signed)
 Blood pressure at goal on amlodipine  valsartan  5-1 60 once daily.

## 2023-10-07 NOTE — Assessment & Plan Note (Signed)
 Check PSA. ?

## 2023-10-07 NOTE — Patient Instructions (Signed)
 It was very nice to see you today!  I will refer you to see the cardiologist.  Will also refer you for a sleep study and also to see the dermatologist.  We will check blood work today.  Return in about 6 months (around 04/05/2024).   Take care, Dr Daneil Dunker  PLEASE NOTE:  If you had any lab tests, please let us  know if you have not heard back within a few days. You may see your results on mychart before we have a chance to review them but we will give you a call once they are reviewed by us .   If we ordered any referrals today, please let us  know if you have not heard from their office within the next week.   If you had any urgent prescriptions sent in today, please check with the pharmacy within an hour of our visit to make sure the prescription was transmitted appropriately.   Please try these tips to maintain a healthy lifestyle:  Eat at least 3 REAL meals and 1-2 snacks per day.  Aim for no more than 5 hours between eating.  If you eat breakfast, please do so within one hour of getting up.   Each meal should contain half fruits/vegetables, one quarter protein, and one quarter carbs (no bigger than a computer mouse)  Cut down on sweet beverages. This includes juice, soda, and sweet tea.   Drink at least 1 glass of water with each meal and aim for at least 8 glasses per day  Exercise at least 150 minutes every week.

## 2023-10-08 ENCOUNTER — Encounter: Payer: Self-pay | Admitting: Family Medicine

## 2023-10-08 NOTE — Progress Notes (Signed)
His labs are all at goal.  Cholesterol and blood sugar are better than last year when we checked.  Do not need to make any adjustments to treatment plan.  He should continue to work on diet and exercise and we can recheck in a year.

## 2023-10-14 ENCOUNTER — Encounter: Payer: Self-pay | Admitting: Family Medicine

## 2023-10-24 DIAGNOSIS — L821 Other seborrheic keratosis: Secondary | ICD-10-CM | POA: Diagnosis not present

## 2023-10-24 DIAGNOSIS — C44329 Squamous cell carcinoma of skin of other parts of face: Secondary | ICD-10-CM | POA: Diagnosis not present

## 2023-10-26 ENCOUNTER — Other Ambulatory Visit: Payer: Self-pay | Admitting: Family Medicine

## 2023-10-28 DIAGNOSIS — K635 Polyp of colon: Secondary | ICD-10-CM | POA: Diagnosis not present

## 2023-10-28 DIAGNOSIS — Z1211 Encounter for screening for malignant neoplasm of colon: Secondary | ICD-10-CM | POA: Diagnosis not present

## 2023-10-28 DIAGNOSIS — Z860100 Personal history of colon polyps, unspecified: Secondary | ICD-10-CM | POA: Diagnosis not present

## 2023-10-30 DIAGNOSIS — Z6831 Body mass index (BMI) 31.0-31.9, adult: Secondary | ICD-10-CM | POA: Diagnosis not present

## 2023-10-30 DIAGNOSIS — E6609 Other obesity due to excess calories: Secondary | ICD-10-CM | POA: Diagnosis not present

## 2023-10-30 DIAGNOSIS — I10 Essential (primary) hypertension: Secondary | ICD-10-CM | POA: Diagnosis not present

## 2023-10-30 DIAGNOSIS — J4521 Mild intermittent asthma with (acute) exacerbation: Secondary | ICD-10-CM | POA: Diagnosis not present

## 2023-10-30 DIAGNOSIS — G4733 Obstructive sleep apnea (adult) (pediatric): Secondary | ICD-10-CM | POA: Diagnosis not present

## 2023-10-30 DIAGNOSIS — K219 Gastro-esophageal reflux disease without esophagitis: Secondary | ICD-10-CM | POA: Diagnosis not present

## 2023-11-01 ENCOUNTER — Other Ambulatory Visit: Payer: Self-pay | Admitting: Family Medicine

## 2023-11-04 DIAGNOSIS — K635 Polyp of colon: Secondary | ICD-10-CM | POA: Diagnosis not present

## 2023-11-06 DIAGNOSIS — R0902 Hypoxemia: Secondary | ICD-10-CM | POA: Diagnosis not present

## 2023-11-06 DIAGNOSIS — G4761 Periodic limb movement disorder: Secondary | ICD-10-CM | POA: Diagnosis not present

## 2023-11-06 DIAGNOSIS — G4736 Sleep related hypoventilation in conditions classified elsewhere: Secondary | ICD-10-CM | POA: Diagnosis not present

## 2023-11-06 DIAGNOSIS — G4733 Obstructive sleep apnea (adult) (pediatric): Secondary | ICD-10-CM | POA: Diagnosis not present

## 2023-11-13 DIAGNOSIS — C44329 Squamous cell carcinoma of skin of other parts of face: Secondary | ICD-10-CM | POA: Diagnosis not present

## 2023-11-20 DIAGNOSIS — E6609 Other obesity due to excess calories: Secondary | ICD-10-CM | POA: Diagnosis not present

## 2023-11-20 DIAGNOSIS — J4521 Mild intermittent asthma with (acute) exacerbation: Secondary | ICD-10-CM | POA: Diagnosis not present

## 2023-11-20 DIAGNOSIS — G4733 Obstructive sleep apnea (adult) (pediatric): Secondary | ICD-10-CM | POA: Diagnosis not present

## 2023-11-20 DIAGNOSIS — Z6831 Body mass index (BMI) 31.0-31.9, adult: Secondary | ICD-10-CM | POA: Diagnosis not present

## 2023-11-20 DIAGNOSIS — I10 Essential (primary) hypertension: Secondary | ICD-10-CM | POA: Diagnosis not present

## 2023-11-22 DIAGNOSIS — G4733 Obstructive sleep apnea (adult) (pediatric): Secondary | ICD-10-CM | POA: Diagnosis not present

## 2023-11-22 DIAGNOSIS — R0902 Hypoxemia: Secondary | ICD-10-CM | POA: Diagnosis not present

## 2023-11-22 DIAGNOSIS — G4736 Sleep related hypoventilation in conditions classified elsewhere: Secondary | ICD-10-CM | POA: Diagnosis not present

## 2023-11-27 DIAGNOSIS — K648 Other hemorrhoids: Secondary | ICD-10-CM | POA: Diagnosis not present

## 2023-11-27 DIAGNOSIS — E6609 Other obesity due to excess calories: Secondary | ICD-10-CM | POA: Diagnosis not present

## 2023-11-27 DIAGNOSIS — D126 Benign neoplasm of colon, unspecified: Secondary | ICD-10-CM | POA: Diagnosis not present

## 2023-11-27 DIAGNOSIS — Z6830 Body mass index (BMI) 30.0-30.9, adult: Secondary | ICD-10-CM | POA: Diagnosis not present

## 2023-11-27 DIAGNOSIS — K5730 Diverticulosis of large intestine without perforation or abscess without bleeding: Secondary | ICD-10-CM | POA: Diagnosis not present

## 2023-11-27 DIAGNOSIS — K080 Exfoliation of teeth due to systemic causes: Secondary | ICD-10-CM | POA: Diagnosis not present

## 2023-12-02 NOTE — Progress Notes (Unsigned)
 Cardiology Office Note:  .   Date:  12/03/2023  ID:  Merla Riches, DOB 13-May-1961, MRN [id number] PCP: Ardith Dark, MD  [facility] Providers Cardiologist:  Lance Muss, MD {  History of Present Illness: .   Danny Wilkerson is a 63 y.o. male with a past medical history of coronary artery calcification here for follow-up appointment.  He was last seen February 2023 and at that time was doing okay.  Older brother had an MI.  Denied dizziness and leg edema.  No nitroglycerin use.  No palpitations or PND.  No shortness of breath or syncope.  Had some left-sided chest pain after helping carry a casket.  Lasted for few hours.  No other associated symptoms.  Declining back surgery.  Walks and hikes for exercise.  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  Training is very physical.  Today, he tells me no chest pain or shortness of breath.  Remains very active.  Continues a Mediterranean diet.  Cholesterol is under excellent control with recent LDL 47 which was just done by his PCP back in February.  Overall, feeling well today.  Asking about further testing needed with his high calcium score.  We discussed preventative medicine and that no other testing would be indicated right now since he is asymptomatic.  Encouraged him to reach out if symptoms develop and we can absolutely work up his known coronary disease.  EKG reviewed from primary care office today.  Overall, feeling well.  Reports no shortness of breath nor dyspnea on exertion. Reports no chest pain, pressure, or tightness. No edema, orthopnea, PND. Reports no palpitations.    ROS: pertinent ROS in HPI  Studies Reviewed: Marland Kitchen        Coronary calcium score January 2023:  Coronary arteries: Normal origins.   Coronary Calcium Score:   Left main: 0   Left anterior descending artery: 482   Left circumflex artery: 148   Right coronary artery: 549   Total: 1178   Percentile: 96th   Pericardium: Normal.   Ascending  Aorta: Normal caliber.  Scattered calcifications.   Non-cardiac: See separate report from [facility].   IMPRESSION: Coronary calcium score of 1178. This was 96th percentile for age-, race-, and sex-matched controls."       Physical Exam:   VS:  BP 128/64   Pulse 72   Ht 5\' 8"  (1.727 m)   Wt 200 lb (90.7 kg)   SpO2 97%   BMI 30.41 kg/m    Wt Readings from Last 3 Encounters:  12/03/23 200 lb (90.7 kg)  10/07/23 218 lb (98.9 kg)  02/27/23 213 lb (96.6 kg)    GEN: Well nourished, well developed in no acute distress NECK: No JVD; No carotid bruits CARDIAC: RRR, no murmurs, rubs, gallops RESPIRATORY:  Clear to auscultation without rales, wheezing or rhonchi  ABDOMEN: Soft, non-tender, non-distended EXTREMITIES:  No edema; No deformity   ASSESSMENT AND PLAN: .   CAD - Asymptomatic at this time - Recent LDL and cholesterol panel looked great - Continue current medication regimen including Lipitor 80 mg daily, Exforge 5-160 mg daily  Hyperlipidemia -numbers are well controlled today -continue monitoring through PCP  Hypertension - Blood pressure well-controlled today, 128/64 - Continue to keep track at home - Continue low-sodium, heart healthy diet   Diet and exercise  -Reviewed Mediterranean diet and exercise for 30 minutes x 5 days a week at a bare minimum -Proper hydration, 64 ounces of water daily  Dispo: He can follow-up in a year with me  Signed, Sharlene Dory, PA-C

## 2023-12-03 ENCOUNTER — Ambulatory Visit: Payer: Medicare Other | Attending: Cardiology | Admitting: Physician Assistant

## 2023-12-03 VITALS — BP 128/64 | HR 72 | Ht 68.0 in | Wt 200.0 lb

## 2023-12-03 DIAGNOSIS — I2510 Atherosclerotic heart disease of native coronary artery without angina pectoris: Secondary | ICD-10-CM

## 2023-12-03 DIAGNOSIS — I158 Other secondary hypertension: Secondary | ICD-10-CM | POA: Diagnosis not present

## 2023-12-03 DIAGNOSIS — G4733 Obstructive sleep apnea (adult) (pediatric): Secondary | ICD-10-CM | POA: Diagnosis not present

## 2023-12-03 DIAGNOSIS — Z6831 Body mass index (BMI) 31.0-31.9, adult: Secondary | ICD-10-CM | POA: Diagnosis not present

## 2023-12-03 DIAGNOSIS — E785 Hyperlipidemia, unspecified: Secondary | ICD-10-CM | POA: Diagnosis not present

## 2023-12-03 DIAGNOSIS — J4521 Mild intermittent asthma with (acute) exacerbation: Secondary | ICD-10-CM | POA: Diagnosis not present

## 2023-12-03 DIAGNOSIS — M109 Gout, unspecified: Secondary | ICD-10-CM | POA: Diagnosis not present

## 2023-12-03 DIAGNOSIS — I10 Essential (primary) hypertension: Secondary | ICD-10-CM | POA: Diagnosis not present

## 2023-12-03 NOTE — Patient Instructions (Signed)
 Medication Instructions:   Your physician recommends that you continue on your current medications as directed. Please refer to the Current Medication list given to you today.  *If you need a refill on your cardiac medications before your next appointment, please call your pharmacy*    Follow-Up: At [facility], you and your health needs are our priority.  As part of our continuing mission to provide you with exceptional heart care, our providers are all part of one team.  This team includes your primary Cardiologist (physician) and Advanced Practice Providers or APPs (Physician Assistants and Nurse Practitioners) who all work together to provide you with the care you need, when you need it.  Your next appointment:   1 year(s)  Provider:   Jari Favre PA-C  We recommend signing up for the patient portal called "MyChart".  Sign up information is provided on this After Visit Summary.  MyChart is used to connect with patients for Virtual Visits (Telemedicine).  Patients are able to view lab/test results, encounter notes, upcoming appointments, etc.  Non-urgent messages can be sent to your provider as well.   To learn more about what you can do with MyChart, go to [email or web address].         1st Floor: - Lobby - Registration  - Pharmacy  - Lab - Cafe  2nd Floor: - PV Lab - Diagnostic Testing (echo, CT, nuclear med)  3rd Floor: - Vacant  4th Floor: - TCTS (cardiothoracic surgery) - AFib Clinic - Structural Heart Clinic - Vascular Surgery  - Vascular Ultrasound  5th Floor: - HeartCare Cardiology (general and EP) - Clinical Pharmacy for coumadin, hypertension, lipid, weight-loss medications, and med management appointments    Valet parking services will be available as well.

## 2023-12-19 DIAGNOSIS — G4733 Obstructive sleep apnea (adult) (pediatric): Secondary | ICD-10-CM | POA: Diagnosis not present

## 2024-01-01 DIAGNOSIS — G4733 Obstructive sleep apnea (adult) (pediatric): Secondary | ICD-10-CM | POA: Diagnosis not present

## 2024-01-10 ENCOUNTER — Encounter: Payer: Self-pay | Admitting: Family Medicine

## 2024-01-10 ENCOUNTER — Ambulatory Visit: Payer: BC Managed Care – PPO | Admitting: Family Medicine

## 2024-01-10 VITALS — BP 132/64 | HR 67 | Temp 97.5°F | Ht 68.0 in | Wt 204.0 lb

## 2024-01-10 DIAGNOSIS — G8929 Other chronic pain: Secondary | ICD-10-CM

## 2024-01-10 DIAGNOSIS — Z0001 Encounter for general adult medical examination with abnormal findings: Secondary | ICD-10-CM

## 2024-01-10 DIAGNOSIS — I10 Essential (primary) hypertension: Secondary | ICD-10-CM

## 2024-01-10 DIAGNOSIS — G4733 Obstructive sleep apnea (adult) (pediatric): Secondary | ICD-10-CM

## 2024-01-10 DIAGNOSIS — R739 Hyperglycemia, unspecified: Secondary | ICD-10-CM

## 2024-01-10 DIAGNOSIS — R972 Elevated prostate specific antigen [PSA]: Secondary | ICD-10-CM | POA: Diagnosis not present

## 2024-01-10 DIAGNOSIS — E785 Hyperlipidemia, unspecified: Secondary | ICD-10-CM

## 2024-01-10 DIAGNOSIS — I2510 Atherosclerotic heart disease of native coronary artery without angina pectoris: Secondary | ICD-10-CM

## 2024-01-10 DIAGNOSIS — I2583 Coronary atherosclerosis due to lipid rich plaque: Secondary | ICD-10-CM

## 2024-01-10 MED ORDER — ASPIRIN 81 MG PO TBEC: 81.0000 mg | DELAYED_RELEASE_TABLET | Freq: Every day | ORAL | Status: AC

## 2024-01-10 MED ORDER — HYDROCODONE-ACETAMINOPHEN 5-325 MG PO TABS: 1.0000 | ORAL_TABLET | Freq: Four times a day (QID) | ORAL | 0 refills | Status: AC | PRN

## 2024-01-10 NOTE — Assessment & Plan Note (Signed)
 He has established with cardiology since our last visit.  Currently on Lipitor 80 mg daily and aspirin 81 mg daily.

## 2024-01-10 NOTE — Progress Notes (Signed)
 Chief Complaint:  Danny Wilkerson is a 63 y.o. male who presents today for his annual comprehensive physical exam.    Assessment/Plan:  Chronic Problems Addressed Today: Benign essential hypertension At goal today on amlodipine  valsartan  5-160 once daily.  Elevated PSA Most recent PSA was at goal.  Dyslipidemia Doing well with lipitor 80 mg daily.  Most recent lipid panel was at goal.  Hyperglycemia Last A1c improved to 5.8.  We can recheck again in a year.  He is working on lifestyle interventions.  Chronic pain Database without red flags.  Sparingly uses Norco 5-3 25 as needed.  Last refill was 10 months ago.  Medication helps with managing his pain and ability to stay active and perform activities of daily living during times of flareup.  He has not had any significant side effects.  Will refill today.  Follow-up in 3 to 6 months for med refill.  Coronary artery disease He has established with cardiology since our last visit.  Currently on Lipitor 80 mg daily and aspirin 81 mg daily.  OSA on CPAP Doing well with CPAP that was just started a few weeks ago.   Preventative Healthcare: Up-to-date on colon cancer screening.  Up-to-date on shingles vaccine-will obtain records.  Declined pneumonia vaccine.  Patient Counseling(The following topics were reviewed and/or handout was given):  -Nutrition: Stressed importance of moderation in sodium/caffeine intake, saturated fat and cholesterol, caloric balance, sufficient intake of fresh fruits, vegetables, and fiber.  -Stressed the importance of regular exercise.   -Substance Abuse: Discussed cessation/primary prevention of tobacco, alcohol, or other drug use; driving or other dangerous activities under the influence; availability of treatment for abuse.   -Injury prevention: Discussed safety belts, safety helmets, smoke detector, smoking near bedding or upholstery.   -Sexuality: Discussed sexually transmitted diseases, partner  selection, use of condoms, avoidance of unintended pregnancy and contraceptive alternatives.   -Dental health: Discussed importance of regular tooth brushing, flossing, and dental visits.  -Health maintenance and immunizations reviewed. Please refer to Health maintenance section.  Return to care in 1 year for next preventative visit.     Subjective:  HPI:  He has no acute complaints today. He is here today for annual physical. Since our last visit he establish with cardiology. He was also started on CPAP for his OSA. Doing well with this. Also had his colonoscopy a few months ago.  Was told he would need repeat colonoscopy in 5 years.  Lifestyle Diet: Balanced. Trying to get more fruits and vegetables. Lean proteins.  Exercise: Walks routinely.      01/10/2024    9:22 AM  Depression screen PHQ 2/9  Decreased Interest 0  Down, Depressed, Hopeless 0  PHQ - 2 Score 0    Health Maintenance Due  Topic Date Due   Medicare Annual Wellness (AWV)  Never done     ROS: Per HPI, otherwise a complete review of systems was negative.   PMH:  The following were reviewed and entered/updated in epic: Past Medical History:  Diagnosis Date   Anxiety    Arthritis    Hypertension    Sleep apnea    Patient Active Problem List   Diagnosis Date Noted   OSA on CPAP 10/07/2023   Actinic keratosis 02/27/2023   Coronary artery disease 09/03/2022   Generalized anxiety disorder 08/30/2021   Chronic pain 08/30/2021   Elevated PSA 08/30/2020   Dyslipidemia 08/30/2020   Hyperglycemia 08/30/2020   Tinnitus 08/29/2020   GERD (gastroesophageal reflux disease) 08/29/2020  Arthritis    Benign essential hypertension 09/24/2013   History reviewed. No pertinent surgical history.  Family History  Problem Relation Age of Onset   Arthritis Mother    Cancer Mother    Depression Mother    Hearing loss Mother    Arthritis Father    COPD Father    Hearing loss Father    Heart attack Brother      Medications- reviewed and updated Current Outpatient Medications  Medication Sig Dispense Refill   amLODipine -valsartan  (EXFORGE ) 5-160 MG tablet TAKE 1 TABLET BY MOUTH EVERY DAY 90 tablet 3   aspirin EC 81 MG tablet Take 1 tablet (81 mg total) by mouth daily. Swallow whole.     atorvastatin  (LIPITOR) 80 MG tablet TAKE 1 TABLET BY MOUTH EVERY DAY 90 tablet 0   colchicine 0.6 MG tablet Take 1 tablet by mouth 2 (two) times daily as needed. Gout flare up     omeprazole (PRILOSEC) 40 MG capsule Take 40 mg by mouth daily.     HYDROcodone -acetaminophen  (NORCO/VICODIN) 5-325 MG tablet Take 1 tablet by mouth every 6 (six) hours as needed. 120 tablet 0   No current facility-administered medications for this visit.    Allergies-reviewed and updated Allergies  Allergen Reactions   Prednisone Other (See Comments)    SUICIDAL   Ampicillin Other (See Comments)    Childhood reaction   Meperidine Nausea And Vomiting    Social History   Socioeconomic History   Marital status: Divorced    Spouse name: Not on file   Number of children: Not on file   Years of education: Not on file   Highest education level: 12th grade  Occupational History   Not on file  Tobacco Use   Smoking status: Never   Smokeless tobacco: Not on file  Substance and Sexual Activity   Alcohol use: Yes    Alcohol/week: 12.0 standard drinks of alcohol    Types: 12 Cans of beer per week   Drug use: Never   Sexual activity: Not Currently  Other Topics Concern   Not on file  Social History Narrative   Not on file   Social Drivers of Health   Financial Resource Strain: Low Risk  (10/05/2023)   Overall Financial Resource Strain (CARDIA)    Difficulty of Paying Living Expenses: Not hard at all  Food Insecurity: No Food Insecurity (10/05/2023)   Hunger Vital Sign    Worried About Running Out of Food in the Last Year: Never true    Ran Out of Food in the Last Year: Never true  Transportation Needs: No Transportation  Needs (10/05/2023)   PRAPARE - Administrator, Civil Service (Medical): No    Lack of Transportation (Non-Medical): No  Physical Activity: Sufficiently Active (10/05/2023)   Exercise Vital Sign    Days of Exercise per Week: 7 days    Minutes of Exercise per Session: 30 min  Stress: Stress Concern Present (10/05/2023)   [employer] of Occupational Health - Occupational Stress Questionnaire    Feeling of Stress : To some extent  Social Connections: Unknown (10/05/2023)   Social Connection and Isolation Panel [NHANES]    Frequency of Communication with Friends and Family: Twice a week    Frequency of Social Gatherings with Friends and Family: Three times a week    Attends Religious Services: Patient declined    Active Member of Clubs or Organizations: No    Attends Banker Meetings: Not on file  Marital Status: Divorced        Objective:  Physical Exam: BP 132/64   Pulse 67   Temp (!) 97.5 F (36.4 C) (Temporal)   Ht 5\' 8"  (1.727 m)   Wt 204 lb (92.5 kg)   SpO2 100%   BMI 31.02 kg/m   Body mass index is 31.02 kg/m. Wt Readings from Last 3 Encounters:  01/10/24 204 lb (92.5 kg)  12/03/23 200 lb (90.7 kg)  10/07/23 218 lb (98.9 kg)   Gen: NAD, resting comfortably HEENT: TMs normal bilaterally. OP clear. No thyromegaly noted.  CV: RRR with no murmurs appreciated Pulm: NWOB, CTAB with no crackles, wheezes, or rhonchi GI: Normal bowel sounds present. Soft, Nontender, Nondistended. MSK: no edema, cyanosis, or clubbing noted Skin: warm, dry Neuro: CN2-12 grossly intact. Strength 5/5 in upper and lower extremities. Reflexes symmetric and intact bilaterally.  Psych: Normal affect and thought content     Jayce Kainz M. Daneil Dunker, MD 01/10/2024 10:01 AM

## 2024-01-10 NOTE — Assessment & Plan Note (Signed)
 At goal today on amlodipine  valsartan  5-160 once daily.

## 2024-01-10 NOTE — Assessment & Plan Note (Signed)
 Doing well with lipitor 80 mg daily.  Most recent lipid panel was at goal.

## 2024-01-10 NOTE — Assessment & Plan Note (Signed)
 Most recent PSA was at goal.

## 2024-01-10 NOTE — Assessment & Plan Note (Signed)
 Last A1c improved to 5.8.  We can recheck again in a year.  He is working on lifestyle interventions.

## 2024-01-10 NOTE — Assessment & Plan Note (Signed)
 Doing well with CPAP that was just started a few weeks ago.

## 2024-01-10 NOTE — Assessment & Plan Note (Signed)
 Database without red flags.  Sparingly uses Norco 5-3 25 as needed.  Last refill was 10 months ago.  Medication helps with managing his pain and ability to stay active and perform activities of daily living during times of flareup.  He has not had any significant side effects.  Will refill today.  Follow-up in 3 to 6 months for med refill.

## 2024-01-10 NOTE — Patient Instructions (Signed)
 It was very nice to see you today!  I am glad that you are doing well.  I will refill your medications.  Please continue to work on diet and exercise.  I will see back in a year for your next physical.  Please come back sooner if needed.  Return in about 1 year (around 01/09/2025) for Annual Physical.   Take care, Dr Daneil Dunker  PLEASE NOTE:  If you had any lab tests, please let us  know if you have not heard back within a few days. You may see your results on mychart before we have a chance to review them but we will give you a call once they are reviewed by us .   If we ordered any referrals today, please let us  know if you have not heard from their office within the next week.   If you had any urgent prescriptions sent in today, please check with the pharmacy within an hour of our visit to make sure the prescription was transmitted appropriately.   Please try these tips to maintain a healthy lifestyle:  Eat at least 3 REAL meals and 1-2 snacks per day.  Aim for no more than 5 hours between eating.  If you eat breakfast, please do so within one hour of getting up.   Each meal should contain half fruits/vegetables, one quarter protein, and one quarter carbs (no bigger than a computer mouse)  Cut down on sweet beverages. This includes juice, soda, and sweet tea.   Drink at least 1 glass of water with each meal and aim for at least 8 glasses per day  Exercise at least 150 minutes every week.    Preventive Care 84 Years and Older, Male Preventive care refers to lifestyle choices and visits with your health care provider that can promote health and wellness. Preventive care visits are also called wellness exams. What can I expect for my preventive care visit? Counseling During your preventive care visit, your health care provider may ask about your: Medical history, including: Past medical problems. Family medical history. History of falls. Current health, including: Emotional  well-being. Home life and relationship well-being. Sexual activity. Memory and ability to understand (cognition). Lifestyle, including: Alcohol, nicotine or tobacco, and drug use. Access to firearms. Diet, exercise, and sleep habits. Work and work Astronomer. Sunscreen use. Safety issues such as seatbelt and bike helmet use. Physical exam Your health care provider will check your: Height and weight. These may be used to calculate your BMI (body mass index). BMI is a measurement that tells if you are at a healthy weight. Waist circumference. This measures the distance around your waistline. This measurement also tells if you are at a healthy weight and may help predict your risk of certain diseases, such as type 2 diabetes and high blood pressure. Heart rate and blood pressure. Body temperature. Skin for abnormal spots. What immunizations do I need?  Vaccines are usually given at various ages, according to a schedule. Your health care provider will recommend vaccines for you based on your age, medical history, and lifestyle or other factors, such as travel or where you work. What tests do I need? Screening Your health care provider may recommend screening tests for certain conditions. This may include: Lipid and cholesterol levels. Diabetes screening. This is done by checking your blood sugar (glucose) after you have not eaten for a while (fasting). Hepatitis C test. Hepatitis B test. HIV (human immunodeficiency virus) test. STI (sexually transmitted infection) testing, if you are at  risk. Lung cancer screening. Colorectal cancer screening. Prostate cancer screening. Abdominal aortic aneurysm (AAA) screening. You may need this if you are a current or former smoker. Talk with your health care provider about your test results, treatment options, and if necessary, the need for more tests. Follow these instructions at home: Eating and drinking  Eat a diet that includes fresh fruits  and vegetables, whole grains, lean protein, and low-fat dairy products. Limit your intake of foods with high amounts of sugar, saturated fats, and salt. Take vitamin and mineral supplements as recommended by your health care provider. Do not drink alcohol if your health care provider tells you not to drink. If you drink alcohol: Limit how much you have to 0-2 drinks a day. Know how much alcohol is in your drink. In the [place] one drink equals one 12 oz bottle of beer (355 mL), one 5 oz glass of wine (148 mL), or one 1 oz glass of hard liquor (44 mL). Lifestyle Brush your teeth every morning and night with fluoride toothpaste. Floss one time each day. Exercise for at least 30 minutes 5 or more days each week. Do not use any products that contain nicotine or tobacco. These products include cigarettes, chewing tobacco, and vaping devices, such as e-cigarettes. If you need help quitting, ask your health care provider. Do not use drugs. If you are sexually active, practice safe sex. Use a condom or other form of protection to prevent STIs. Take aspirin only as told by your health care provider. Make sure that you understand how much to take and what form to take. Work with your health care provider to find out whether it is safe and beneficial for you to take aspirin daily. Ask your health care provider if you need to take a cholesterol-lowering medicine (statin). Find healthy ways to manage stress, such as: Meditation, yoga, or listening to music. Journaling. Talking to a trusted person. Spending time with friends and family. Safety Always wear your seat belt while driving or riding in a vehicle. Do not drive: If you have been drinking alcohol. Do not ride with someone who has been drinking. When you are tired or distracted. While texting. If you have been using any mind-altering substances or drugs. Wear a helmet and other protective equipment during sports activities. If you have firearms in  your house, make sure you follow all gun safety procedures. Minimize exposure to UV radiation to reduce your risk of skin cancer. What's next? Visit your health care provider once a year for an annual wellness visit. Ask your health care provider how often you should have your eyes and teeth checked. Stay up to date on all vaccines. This information is not intended to replace advice given to you by your health care provider. Make sure you discuss any questions you have with your health care provider. Document Revised: 02/08/2021 Document Reviewed: 02/08/2021 Elsevier Patient Education  2024 [employer].

## 2024-01-18 DIAGNOSIS — G4733 Obstructive sleep apnea (adult) (pediatric): Secondary | ICD-10-CM | POA: Diagnosis not present

## 2024-01-23 ENCOUNTER — Other Ambulatory Visit: Payer: Self-pay | Admitting: Family Medicine

## 2024-02-04 DIAGNOSIS — H524 Presbyopia: Secondary | ICD-10-CM | POA: Diagnosis not present

## 2024-02-18 DIAGNOSIS — G4733 Obstructive sleep apnea (adult) (pediatric): Secondary | ICD-10-CM | POA: Diagnosis not present

## 2024-03-05 ENCOUNTER — Other Ambulatory Visit (HOSPITAL_COMMUNITY): Payer: Self-pay

## 2024-03-05 ENCOUNTER — Ambulatory Visit (INDEPENDENT_AMBULATORY_CARE_PROVIDER_SITE_OTHER): Admitting: Family Medicine

## 2024-03-05 VITALS — BP 116/58 | HR 59 | Temp 98.6°F | Ht 68.0 in | Wt 199.8 lb

## 2024-03-05 DIAGNOSIS — G4700 Insomnia, unspecified: Secondary | ICD-10-CM | POA: Insufficient documentation

## 2024-03-05 DIAGNOSIS — F411 Generalized anxiety disorder: Secondary | ICD-10-CM

## 2024-03-05 DIAGNOSIS — M25551 Pain in right hip: Secondary | ICD-10-CM | POA: Diagnosis not present

## 2024-03-05 DIAGNOSIS — H9319 Tinnitus, unspecified ear: Secondary | ICD-10-CM | POA: Diagnosis not present

## 2024-03-05 MED ORDER — MELOXICAM 15 MG PO TABS: 15.0000 mg | ORAL_TABLET | Freq: Every day | ORAL | 0 refills | Status: DC

## 2024-03-05 MED ORDER — HYDROXYZINE HCL 50 MG PO TABS: 25.0000 mg | ORAL_TABLET | Freq: Every evening | ORAL | 0 refills | Status: DC | PRN

## 2024-03-05 MED ORDER — SERTRALINE HCL 50 MG PO TABS: 50.0000 mg | ORAL_TABLET | Freq: Every day | ORAL | 3 refills | Status: DC

## 2024-03-05 NOTE — Assessment & Plan Note (Signed)
 Symptoms have worsened significantly recently as well likely due to his worsening tinnitus as above.  He has been on Zoloft  in the past and has done very well with this and he would like to restart.  Will send this in 50 mg daily.  He will send us  a message in a few weeks and we can adjust the dose as tolerated.

## 2024-03-05 NOTE — Patient Instructions (Addendum)
 It was very nice to see you today!  We will start hydroxyzine  for sleep and sertraline  for your anxiety. I will also refer you to ENT.   Please start the meloxicam  for your hip.  We will also check an x-ray today.  Please send me a message in a couple of weeks to let us  know how you are doing.  Return if symptoms worsen or fail to improve.   Take care, Dr Kennyth  PLEASE NOTE:  If you had any lab tests, please let us  know if you have not heard back within a few days. You may see your results on mychart before we have a chance to review them but we will give you a call once they are reviewed by us .   If we ordered any referrals today, please let us  know if you have not heard from their office within the next week.   If you had any urgent prescriptions sent in today, please check with the pharmacy within an hour of our visit to make sure the prescription was transmitted appropriately.   Please try these tips to maintain a healthy lifestyle:  Eat at least 3 REAL meals and 1-2 snacks per day.  Aim for no more than 5 hours between eating.  If you eat breakfast, please do so within one hour of getting up.   Each meal should contain half fruits/vegetables, one quarter protein, and one quarter carbs (no bigger than a computer mouse)  Cut down on sweet beverages. This includes juice, soda, and sweet tea.   Drink at least 1 glass of water with each meal and aim for at least 8 glasses per day  Exercise at least 150 minutes every week.

## 2024-03-05 NOTE — Assessment & Plan Note (Signed)
 This is worsening significantly the last several weeks likely due to his above tinnitus.  Given severity of symptoms would be reasonable to start medications at this point.  We did discuss potential treatment options.  He would like to avoid Ambien if possible.  I will start hydroxyzine .  He is aware of potential side effects.  He will follow-up with us  in a few weeks via MyChart and we can adjust as needed.

## 2024-03-05 NOTE — Assessment & Plan Note (Signed)
 Symptoms have flared up significantly since our last visit.  No clear precipitating events though does have slight middle ear effusion today and likely has some underlying mild eustachian tube dysfunction as well.  This is causing quite a bit of distress and also causing issues with insomnia that we are addressing as below.  He is agreeable to ENT referral for further evaluation.  Will place referral today.

## 2024-03-05 NOTE — Progress Notes (Signed)
   Danny Wilkerson is a 63 y.o. male who presents today for an office visit.  Assessment/Plan:  New/Acute Problems: Right Hip Pain Concern for osteoarthritis flare based on history and exam though does have remote history of soft tissue injury in the area as well.  Will check plain film today to further evaluate given that symptoms have been persistent for the last month or so.  Also start meloxicam .  He has done well with this in the past.  He will follow-up with us  in a couple of weeks.  Depending on response to meloxicam  and plain film results would consider referral to PT or sports medicine.  Chronic Problems Addressed Today: Tinnitus Symptoms have flared up significantly since our last visit.  No clear precipitating events though does have slight middle ear effusion today and likely has some underlying mild eustachian tube dysfunction as well.  This is causing quite a bit of distress and also causing issues with insomnia that we are addressing as below.  He is agreeable to ENT referral for further evaluation.  Will place referral today.  Insomnia This is worsening significantly the last several weeks likely due to his above tinnitus.  Given severity of symptoms would be reasonable to start medications at this point.  We did discuss potential treatment options.  He would like to avoid Ambien if possible.  I will start hydroxyzine .  He is aware of potential side effects.  He will follow-up with us  in a few weeks via MyChart and we can adjust as needed.  Generalized anxiety disorder Symptoms have worsened significantly recently as well likely due to his worsening tinnitus as above.  He has been on Zoloft  in the past and has done very well with this and he would like to restart.  Will send this in 50 mg daily.  He will send us  a message in a few weeks and we can adjust the dose as tolerated.     Subjective:  HPI:  See assessment / plan for status of chronic conditions.  He is here today  to discuss insomnia. He attributes this to his tinnitus that has worsened significantly the last several weeks. This has made it difficult for him to sleep. Symptoms are manageable during the day though has been under a lot more anxiety.   He has also been having some right hip pain for the last month. Worse with certain movements. HE did have a remote right hip injury many years ago but nothing recent. Pain is stable the last several days.        Objective:  Physical Exam: BP (!) 116/58 (BP Location: Right Arm, Patient Position: Sitting, Cuff Size: Normal)   Pulse (!) 59   Temp 98.6 F (37 C) (Temporal)   Ht 5' 8 (1.727 m)   Wt 199 lb 12.8 oz (90.6 kg)   SpO2 100%   BMI 30.38 kg/m   Gen: No acute distress, resting comfortably HEENT: Bilateral tympanic membrane with clear effusion.  MUSCULOSKELETAL - Legs: No deformities. Pain elicited with internal rotation of right hip.  Also pain with standing and sitting.  Neurovascular intact distally. Neuro: Grossly normal, moves all extremities Psych: Normal affect and thought content      [name], MD 03/05/2024 8:07 AM

## 2024-03-06 ENCOUNTER — Ambulatory Visit (INDEPENDENT_AMBULATORY_CARE_PROVIDER_SITE_OTHER)

## 2024-03-06 DIAGNOSIS — M25551 Pain in right hip: Secondary | ICD-10-CM

## 2024-03-09 ENCOUNTER — Ambulatory Visit: Payer: Self-pay | Admitting: Family Medicine

## 2024-03-09 NOTE — Progress Notes (Signed)
 X-ray confirms hip arthritis.  No fracture or other serious findings.  He should let us  know if pain is not improving with the plan we discussed at his office visit last week.

## 2024-03-27 ENCOUNTER — Other Ambulatory Visit: Payer: Self-pay | Admitting: Family Medicine

## 2024-04-01 ENCOUNTER — Other Ambulatory Visit: Payer: Self-pay | Admitting: Family Medicine

## 2024-04-11 ENCOUNTER — Other Ambulatory Visit: Payer: Self-pay | Admitting: Family Medicine

## 2024-04-25 ENCOUNTER — Other Ambulatory Visit: Payer: Self-pay | Admitting: Family Medicine

## 2024-04-28 ENCOUNTER — Other Ambulatory Visit: Payer: Self-pay | Admitting: Family Medicine

## 2024-05-12 ENCOUNTER — Telehealth (INDEPENDENT_AMBULATORY_CARE_PROVIDER_SITE_OTHER): Payer: Self-pay

## 2024-05-12 NOTE — Telephone Encounter (Signed)
 Pt called left a messaging saying he wanted to cancel his appointment today at 10 am. Pt did not state when or if he would reschedule.

## 2024-05-13 ENCOUNTER — Institutional Professional Consult (permissible substitution) (INDEPENDENT_AMBULATORY_CARE_PROVIDER_SITE_OTHER): Admitting: Otolaryngology

## 2024-05-13 ENCOUNTER — Ambulatory Visit (INDEPENDENT_AMBULATORY_CARE_PROVIDER_SITE_OTHER): Admitting: Audiology

## 2024-07-24 ENCOUNTER — Other Ambulatory Visit: Payer: Self-pay | Admitting: Family Medicine

## 2024-10-06 ENCOUNTER — Ambulatory Visit

## 2025-01-11 ENCOUNTER — Encounter: Admitting: Family Medicine
